# Patient Record
Sex: Female | Born: 1960 | Race: Black or African American | Hispanic: No | Marital: Single | Smoking: Never smoker
Health system: Southern US, Community
[De-identification: ages and names within clinical notes are randomized; demographics above are authoritative.]

## PROBLEM LIST (undated history)

## (undated) DIAGNOSIS — R7881 Bacteremia: Secondary | ICD-10-CM

## (undated) DIAGNOSIS — E669 Obesity, unspecified: Secondary | ICD-10-CM

## (undated) DIAGNOSIS — G4730 Sleep apnea, unspecified: Secondary | ICD-10-CM

## (undated) DIAGNOSIS — I058 Other rheumatic mitral valve diseases: Secondary | ICD-10-CM

## (undated) DIAGNOSIS — I059 Rheumatic mitral valve disease, unspecified: Secondary | ICD-10-CM

## (undated) DIAGNOSIS — D649 Anemia, unspecified: Secondary | ICD-10-CM

## (undated) DIAGNOSIS — Z5189 Encounter for other specified aftercare: Secondary | ICD-10-CM

## (undated) DIAGNOSIS — I10 Essential (primary) hypertension: Secondary | ICD-10-CM

## (undated) DIAGNOSIS — Z86010 Personal history of colonic polyps: Secondary | ICD-10-CM

## (undated) HISTORY — PX: OTHER SURGICAL HISTORY: SHX169

## (undated) HISTORY — PX: TUBAL LIGATION: SHX77

## (undated) HISTORY — DX: Encounter for other specified aftercare: Z51.89

## (undated) HISTORY — DX: Obesity, unspecified: E66.9

## (undated) HISTORY — DX: Rheumatic mitral valve disease, unspecified: I05.9

## (undated) HISTORY — DX: Anemia, unspecified: D64.9

## (undated) HISTORY — DX: Personal history of colonic polyps: Z86.010

---
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
## 2001-02-22 HISTORY — PX: BOWEL RESECTION: SHX1257

## 2006-04-04 ENCOUNTER — Encounter: Admission: RE | Admit: 2006-04-04 | Discharge: 2006-04-04 | Payer: Self-pay | Admitting: Emergency Medicine

## 2006-04-06 ENCOUNTER — Encounter: Admission: RE | Admit: 2006-04-06 | Discharge: 2006-04-06 | Payer: Self-pay | Admitting: Emergency Medicine

## 2009-12-18 ENCOUNTER — Other Ambulatory Visit: Admission: RE | Admit: 2009-12-18 | Discharge: 2009-12-18 | Payer: Self-pay | Admitting: Family Medicine

## 2012-10-06 ENCOUNTER — Encounter: Payer: Self-pay | Admitting: Gynecology

## 2012-10-06 ENCOUNTER — Ambulatory Visit (INDEPENDENT_AMBULATORY_CARE_PROVIDER_SITE_OTHER): Payer: BC Managed Care – PPO | Admitting: Gynecology

## 2012-10-06 VITALS — BP 130/80 | HR 82 | Resp 14 | Ht 66.0 in | Wt 329.0 lb

## 2012-10-06 DIAGNOSIS — Z01419 Encounter for gynecological examination (general) (routine) without abnormal findings: Secondary | ICD-10-CM

## 2012-10-06 DIAGNOSIS — Z124 Encounter for screening for malignant neoplasm of cervix: Secondary | ICD-10-CM

## 2012-10-06 DIAGNOSIS — Z1211 Encounter for screening for malignant neoplasm of colon: Secondary | ICD-10-CM

## 2012-10-06 DIAGNOSIS — Z0000 Encounter for general adult medical examination without abnormal findings: Secondary | ICD-10-CM

## 2012-10-06 LAB — POCT URINALYSIS DIPSTICK: Blood, UA: 2

## 2012-10-06 NOTE — Progress Notes (Signed)
52 y.o. Single African American female  [phone] here for annual exam. Pt reports menses are absent.  She does report hot flashes, does have night sweats, does not have vaginal dryness.  She is not using lubricants,.  She does not report post-menopasual bleeding.  Pt states night sweats and hot flashes are not bothersome enough to treat.  Patient's last menstrual period was 07/24/2011.          Sexually active: yes  The current method of family planning is none.    Exercising: no  Exercise is limited by tendonitis in right foot. Last pap:  2011- Normal Abnormal PAP: no Mammogram: 5 years ago BSE: yes Colonoscopy: no DEXA: no Alcohol: occ drink Tobacco: no  Hgb: 11.1 ; Urine: Blood 2, Trace Leuks, Protein 2  No health maintenance topics applied.  No family history on file.  There are no active problems to display for this patient.   Past Medical History  Diagnosis Date  . Anemia   . Blood transfusion without reported diagnosis Age 52    Past Surgical History  Procedure Laterality Date  . Back surgery  Age 52    Fatty tumor removed  . Tubal ligation  15 years ago  . Bowel resection  2003    Bowel Obstruction Surgery    Allergies: Review of patient's allergies indicates not on file.  Current Outpatient Prescriptions  Medication Sig Dispense Refill  . meloxicam (MOBIC) 15 MG tablet Take 15 mg by mouth daily.      . predniSONE (DELTASONE) 10 MG tablet Take 10 mg by mouth daily.       No current facility-administered medications for this visit.    ROS: A comprehensive review of systems was negative.  Exam:    BP 130/80  Pulse 82  Resp 14  Ht 5\' 6"  (1.676 m)  Wt 329 lb (149.233 kg)  BMI 53.13 kg/m2  LMP 07/24/2011 Weight change: @WEIGHTCHANGE @ Last 3 height recordings:  Ht Readings from Last 3 Encounters:  10/06/12 5\' 6"  (1.676 m)   General appearance: alert, cooperative and appears stated age Head: Normocephalic, without obvious abnormality, atraumatic Neck:  no adenopathy, no carotid bruit, no JVD, supple, symmetrical, trachea midline and thyroid not enlarged, symmetric, no tenderness/mass/nodules Lungs: clear to auscultation bilaterally Breasts: normal appearance, no masses or tenderness Heart: regular rate and rhythm, S1, S2 normal, no murmur, click, rub or gallop Abdomen: soft, non-tender; bowel sounds normal; no masses,  no organomegaly Extremities: extremities normal, atraumatic, no cyanosis or edema Skin: Skin color, texture, turgor normal. No rashes or lesions Lymph nodes: Cervical, supraclavicular, and axillary nodes normal. no inguinal nodes palpated Neurologic: Grossly normal   Pelvic: External genitalia:  no lesions              Urethra: normal appearing urethra with no masses, tenderness or lesions              Bartholins and Skenes: normal                 Vagina: normal appearing vagina with normal color and discharge, no lesions              Cervix: normal appearance              Pap taken: yes        Bimanual Exam:  Uterus:  uterus is normal size, shape, consistency and nontender  Adnexa:    no masses                                      Rectovaginal: Confirms                                      Anus:  normal sphincter tone, no lesions  A: well woman Contraceptive management     P: mammogram due pap smear guidelines reviewed counseled on breast self exam, mammography screening, menopause, adequate intake of calcium and vitamin D, diet and exercise Will rto for fasting lipid and cmet return annually or prn Discussed PAP guideline changes, importance of weight bearing exercises, calcium, vit D and balanced diet.  An After Visit Summary was printed and given to the patient.

## 2012-10-06 NOTE — Patient Instructions (Signed)

## 2012-10-11 ENCOUNTER — Telehealth: Payer: Self-pay | Admitting: *Deleted

## 2012-10-11 NOTE — Telephone Encounter (Signed)
Message copied by Lorraine Lax on Wed Oct 11, 2012 11:08 AM ------      Message from: Douglass Rivers      Created: Wed Oct 11, 2012 10:18 AM       Inform PAP normal recall 2, hemoglobin low to take otc iron and will get CBC when she comes in for fasting labs, recommend she wait 4-6w before coming in for those ------

## 2012-10-11 NOTE — Telephone Encounter (Signed)
Tried to call patient with results, Vm says this is not a working #

## 2012-10-13 ENCOUNTER — Encounter: Payer: Self-pay | Admitting: *Deleted

## 2012-10-13 NOTE — Telephone Encounter (Signed)
Letter Sent for patient to call us.

## 2012-11-09 ENCOUNTER — Other Ambulatory Visit (INDEPENDENT_AMBULATORY_CARE_PROVIDER_SITE_OTHER): Payer: BC Managed Care – PPO

## 2012-11-09 DIAGNOSIS — Z0000 Encounter for general adult medical examination without abnormal findings: Secondary | ICD-10-CM

## 2012-11-09 LAB — COMPREHENSIVE METABOLIC PANEL
AST: 26 U/L (ref 0–37)
Albumin: 4.2 g/dL (ref 3.5–5.2)
CO2: 27 mEq/L (ref 19–32)
Calcium: 9.5 mg/dL (ref 8.4–10.5)
Chloride: 104 mEq/L (ref 96–112)
Potassium: 4 mEq/L (ref 3.5–5.3)
Sodium: 139 mEq/L (ref 135–145)
Total Protein: 7.4 g/dL (ref 6.0–8.3)

## 2012-11-09 LAB — LIPID PANEL
Cholesterol: 221 mg/dL — ABNORMAL HIGH (ref 0–200)
HDL: 77 mg/dL (ref >39)
Triglycerides: 56 mg/dL (ref <150)
VLDL: 11 mg/dL (ref 0–40)

## 2012-11-20 NOTE — Telephone Encounter (Signed)
Patient has not called our office closing encounter.

## 2012-12-05 ENCOUNTER — Ambulatory Visit: Payer: BC Managed Care – PPO | Admitting: Podiatry

## 2012-12-12 ENCOUNTER — Ambulatory Visit: Payer: BC Managed Care – PPO | Admitting: Podiatry

## 2012-12-28 ENCOUNTER — Other Ambulatory Visit: Payer: Self-pay

## 2013-05-04 ENCOUNTER — Encounter: Payer: Self-pay | Admitting: Internal Medicine

## 2013-05-04 ENCOUNTER — Other Ambulatory Visit: Payer: Self-pay | Admitting: Internal Medicine

## 2013-05-04 DIAGNOSIS — Z1231 Encounter for screening mammogram for malignant neoplasm of breast: Secondary | ICD-10-CM

## 2013-05-15 ENCOUNTER — Ambulatory Visit: Payer: Self-pay

## 2013-05-24 ENCOUNTER — Ambulatory Visit
Admission: RE | Admit: 2013-05-24 | Discharge: 2013-05-24 | Disposition: A | Discharge status: Home / Self Care | Payer: BC Managed Care – PPO | Source: Ambulatory Visit | Attending: Internal Medicine | Admitting: Internal Medicine

## 2013-05-24 DIAGNOSIS — Z1231 Encounter for screening mammogram for malignant neoplasm of breast: Secondary | ICD-10-CM

## 2013-06-25 ENCOUNTER — Telehealth: Payer: Self-pay

## 2013-06-25 NOTE — Telephone Encounter (Signed)
Pt scheduled for PV tomorrow (06/26/13).  BMI 53.13 on 10-06-12.  Will check weight tomorrow and f/u with you whether or not pt needs to be done at [facility].  Thank you!

## 2013-06-25 NOTE — Telephone Encounter (Signed)
If BMI is greater than 50, Dr. Celesta Aver next hospital week is June 15.

## 2013-06-26 ENCOUNTER — Ambulatory Visit (AMBULATORY_SURGERY_CENTER): Payer: BC Managed Care – PPO

## 2013-06-26 VITALS — Ht 67.0 in | Wt 349.0 lb

## 2013-06-26 DIAGNOSIS — Z1211 Encounter for screening for malignant neoplasm of colon: Secondary | ICD-10-CM

## 2013-06-26 MED ORDER — NA SULFATE-K SULFATE-MG SULF 17.5-3.13-1.6 GM/177ML PO SOLN: ORAL | Status: DC

## 2013-06-26 NOTE — Progress Notes (Signed)
Pt came into the office today for her pre-visit prior to her colonoscopy with Dr Carlean Purl on 07/10/13. Due to weight restrictions and a BMI of 54.66, pt was informed we would need to schedule her colonoscopy at [facility]. Pt understood. Colon appt on 07/10/13 was cancelled and a message was sent to Sheri-RN to schedule the pt at the hospital and call the pt with the new appointment.I did go over all the prep instructions with the pt. Per pt , no allergies to soy or egg. No O2 or problems with sedation.She is not taking any weight loss meds.She will call if she has any further questions.

## 2013-07-10 ENCOUNTER — Encounter: Payer: BC Managed Care – PPO | Admitting: Internal Medicine

## 2013-09-07 ENCOUNTER — Telehealth: Payer: Self-pay | Admitting: Internal Medicine

## 2013-09-07 ENCOUNTER — Other Ambulatory Visit: Payer: Self-pay

## 2013-09-07 DIAGNOSIS — Z1211 Encounter for screening for malignant neoplasm of colon: Secondary | ICD-10-CM

## 2013-09-07 MED ORDER — NA SULFATE-K SULFATE-MG SULF 17.5-3.13-1.6 GM/177ML PO SOLN
1.0000 | Freq: Once | ORAL | Status: AC
Start: 2013-09-07 — End: 2013-10-07

## 2013-09-07 NOTE — Telephone Encounter (Signed)
Patient is scheduled for a colonoscopy at [facility] for 11/05/13 8:30. Patient is notified of the day and time and agrees to proceed.  She is mailed new instructions and I sent a new rx to the pharmacy

## 2013-10-15 ENCOUNTER — Ambulatory Visit: Payer: BC Managed Care – PPO | Admitting: Gynecology

## 2013-11-02 ENCOUNTER — Encounter: Payer: Self-pay | Admitting: Gynecology

## 2013-11-02 ENCOUNTER — Ambulatory Visit (INDEPENDENT_AMBULATORY_CARE_PROVIDER_SITE_OTHER): Payer: BC Managed Care – PPO | Admitting: Gynecology

## 2013-11-02 ENCOUNTER — Telehealth: Payer: Self-pay | Admitting: Internal Medicine

## 2013-11-02 VITALS — BP 140/80 | HR 88 | Resp 20 | Ht 66.0 in | Wt 356.0 lb

## 2013-11-02 DIAGNOSIS — Z01419 Encounter for gynecological examination (general) (routine) without abnormal findings: Secondary | ICD-10-CM

## 2013-11-02 DIAGNOSIS — Z0000 Encounter for general adult medical examination without abnormal findings: Secondary | ICD-10-CM

## 2013-11-02 LAB — POCT URINALYSIS DIPSTICK
BILIRUBIN UA: NEGATIVE
Glucose, UA: NEGATIVE
KETONES UA: NEGATIVE
LEUKOCYTES UA: NEGATIVE
NITRITE UA: NEGATIVE
PH UA: 5
PROTEIN UA: NEGATIVE
Urobilinogen, UA: NEGATIVE

## 2013-11-02 LAB — HEMOGLOBIN, FINGERSTICK: Hemoglobin, fingerstick: 11.8 g/dL — ABNORMAL LOW (ref 12.0–16.0)

## 2013-11-02 NOTE — Telephone Encounter (Signed)
Spoke with patient and clarified instructions for patient on Monday. She should start the prep on Sunday and on Monday at 3:30 AM do second part.

## 2013-11-02 NOTE — Progress Notes (Signed)
53 y.o. Single African American female   [phone] here for annual exam. Pt reports menses are not regular, pt believes last cycle has now been over 1y.  She does report hot flashes, does have night sweats, does not have vaginal dryness.  She is not using lubricants.    No LMP recorded. Patient is not currently having periods (Reason: Perimenopausal).          Sexually active: No.  The current method of family planning is tubal ligation.    Exercising: Yes.    Walking daily Last pap: 09/2012 neg. HR HPV neg Abnormal PAP: yes Mammogram: 05/2013 BIRADS1: Neg BSE: yes, once a month Colonoscopy none. Has one schedule for next week DEXA: None Alcohol: yes, Occ Tobacco: No  Health Maintenance  Topic Date Due  . Tetanus/tdap  10/30/1980  . Colonoscopy  10/31/2011  . Influenza Vaccine  09/22/2013  . Mammogram  05/25/2015  . Pap Smear  10/07/2015    Family History  Problem Relation Age of Onset  . Adopted: Yes    Patient Active Problem List   Diagnosis Date Noted  . Morbid obesity 10/06/2012    Past Medical History  Diagnosis Date  . Anemia   . Blood transfusion without reported diagnosis Age 57  . Obesity (BMI 35.0-39.9 without comorbidity)     Past Surgical History  Procedure Laterality Date  . Back surgery  Age 49    Fatty tumor removed  . Tubal ligation  15 years ago  . Bowel resection  2003    Bowel Obstruction Surgery    Allergies: Review of patient's allergies indicates no known allergies.  No current outpatient prescriptions on file.   No current facility-administered medications for this visit.    ROS: Pertinent items are noted in HPI.  Exam:    BP 140/80  Pulse 88  Resp 20  Ht 5\' 6"  (1.676 m)  Wt 356 lb (161.481 kg)  BMI 57.49 kg/m2 Weight change: @WEIGHTCHANGE @ Last 3 height recordings:  Ht Readings from Last 3 Encounters:  11/02/13 5\' 6"  (1.676 m)  06/26/13 5\' 7"  (1.702 m)  10/06/12 5\' 6"  (1.676 m)   General appearance: alert, cooperative and  appears stated age Head: Normocephalic, without obvious abnormality, atraumatic Neck: no adenopathy, no carotid bruit, no JVD, supple, symmetrical, trachea midline and thyroid not enlarged, symmetric, no tenderness/mass/nodules Lungs: clear to auscultation bilaterally Breasts: normal appearance, no masses or tenderness Heart: regular rate and rhythm, S1, S2 normal, no murmur, click, rub or gallop Abdomen: soft, non-tender; bowel sounds normal; no masses,  no organomegaly Extremities: extremities normal, atraumatic, no cyanosis or edema Skin: Skin color, texture, turgor normal. No rashes or lesions Lymph nodes: Cervical, supraclavicular, and axillary nodes normal. no inguinal nodes palpated Neurologic: Grossly normal   Pelvic: External genitalia:  no lesions              Urethra: normal appearing urethra with no masses, tenderness or lesions              Bartholins and Skenes: Bartholin's, Urethra, Skene's normal                 Vagina: normal appearing vagina with normal color and discharge, no lesions              Cervix: normal appearance              Pap taken: No.        Bimanual Exam:  Uterus:  uterus is normal size, shape,  consistency and nontender, limited by habitus                                      Adnexa:    no masses                                      Rectovaginal: Confirms                                      Anus:  normal sphincter tone, no lesions      1. Routine gynecological examination  counseled on breast self exam, mammography screening, menopause, adequate intake of calcium and vitamin D, diet and exercise Labs with PCP Informed any bleeding after this point is considered post-menopausal and will need to be evaluated, agrees return annually or prn Discussed PAP guideline changes, importance of weight bearing exercises, calcium, vit D and balanced diet.  2. Laboratory examination ordered as part of a routine general medical examination  - POCT Urinalysis  Dipstick - Hemoglobin, fingerstick  An After Visit Summary was printed and given to the patient.

## 2013-11-05 ENCOUNTER — Encounter (HOSPITAL_COMMUNITY)
Admission: RE | Disposition: A | Discharge status: Home / Self Care | Payer: Self-pay | Source: Ambulatory Visit | Attending: Internal Medicine

## 2013-11-05 ENCOUNTER — Ambulatory Visit (HOSPITAL_COMMUNITY)
Admission: RE | Admit: 2013-11-05 | Discharge: 2013-11-05 | Disposition: A | Discharge status: Home / Self Care | Payer: BC Managed Care – PPO | Source: Ambulatory Visit | Attending: Internal Medicine | Admitting: Internal Medicine

## 2013-11-05 DIAGNOSIS — Z6835 Body mass index (BMI) 35.0-35.9, adult: Secondary | ICD-10-CM | POA: Insufficient documentation

## 2013-11-05 DIAGNOSIS — Z1211 Encounter for screening for malignant neoplasm of colon: Secondary | ICD-10-CM | POA: Insufficient documentation

## 2013-11-05 DIAGNOSIS — Z860100 Personal history of colon polyps, unspecified: Secondary | ICD-10-CM

## 2013-11-05 DIAGNOSIS — D126 Benign neoplasm of colon, unspecified: Secondary | ICD-10-CM

## 2013-11-05 DIAGNOSIS — E669 Obesity, unspecified: Secondary | ICD-10-CM | POA: Insufficient documentation

## 2013-11-05 DIAGNOSIS — K5730 Diverticulosis of large intestine without perforation or abscess without bleeding: Secondary | ICD-10-CM | POA: Diagnosis not present

## 2013-11-05 HISTORY — PX: COLONOSCOPY: SHX5424

## 2013-11-05 HISTORY — DX: Personal history of colonic polyps: Z86.010

## 2013-11-05 HISTORY — DX: Personal history of colon polyps, unspecified: Z86.0100

## 2013-11-05 SURGERY — COLONOSCOPY
Anesthesia: Moderate Sedation

## 2013-11-05 MED ORDER — FENTANYL CITRATE 0.05 MG/ML IJ SOLN
INTRAMUSCULAR | Status: DC | PRN
  Administered 2013-11-05 (×3): 25 ug via INTRAVENOUS

## 2013-11-05 MED ORDER — FENTANYL CITRATE 0.05 MG/ML IJ SOLN
INTRAMUSCULAR | Status: AC
  Filled 2013-11-05: qty 2

## 2013-11-05 MED ORDER — MIDAZOLAM HCL 5 MG/5ML IJ SOLN
INTRAMUSCULAR | Status: DC | PRN
  Administered 2013-11-05 (×4): 2 mg via INTRAVENOUS

## 2013-11-05 MED ORDER — MIDAZOLAM HCL 10 MG/2ML IJ SOLN
INTRAMUSCULAR | Status: AC
  Filled 2013-11-05: qty 2

## 2013-11-05 MED ORDER — SODIUM CHLORIDE 0.9 % IV SOLN: INTRAVENOUS | Status: DC

## 2013-11-05 NOTE — Op Note (Signed)
[facility] [place], [address]   COLONOSCOPY PROCEDURE REPORT  PATIENT: Tiffany Bullock, Tiffany Bullock  MR#: [id number] BIRTHDATE: February 04, 1961 , 53  yrs. old GENDER: Female ENDOSCOPIST: [name], MD, [facility] PROCEDURE DATE:  11/05/2013 PROCEDURE:   Colonoscopy with biopsy First Screening Colonoscopy - Avg.  risk and is 50 yrs.  old or older Yes.  Prior Negative Screening - Now for repeat screening. N/A  History of Adenoma - Now for follow-up colonoscopy & has been > or = to 3 yrs.  N/A  Polyps Removed Today? Yes. ASA CLASS:   Class II INDICATIONS:average risk screening and first colonoscopy. MEDICATIONS: Fentanyl 75 mcg IV and Versed 8 mg IV  DESCRIPTION OF PROCEDURE:   After the risks benefits and alternatives of the procedure were thoroughly explained, informed consent was obtained.  A digital rectal exam revealed no abnormalities of the rectum.   The Pentax Ped Colon [id number] endoscope was introduced through the anus and advanced to the cecum, which was identified by both the appendix and ileocecal valve. No adverse events experienced.   The quality of the prep was excellent using Suprep  The instrument was then slowly withdrawn as the colon was fully examined.  COLON FINDINGS: A sessile polyp measuring 2 mm in size was found in the ascending colon.  A polypectomy was performed with cold forceps.  The resection was complete and the polyp tissue was completely retrieved.   Moderate diverticulosis was noted.   Mild diverticulosis was noted The finding was in the right colon.   The colon mucosa was otherwise normal.   A right colon retroflexion was performed.  Retroflexed views revealed no abnormalities. The time to cecum=5 minutes 0 seconds.  Withdrawal time=8 minutes 0 seconds. The scope was withdrawn and the procedure completed. COMPLICATIONS: There were no complications.  ENDOSCOPIC IMPRESSION: 1.   Sessile polyp measuring 2 mm in size was found in  the ascending colon; polypectomy was performed with cold forceps 2.   Moderate diverticulosis was noted 3.   Mild diverticulosis was noted in the right colon 4.   The colon mucosa was otherwise normal - excellent prep  RECOMMENDATIONS: 1.  Timing of repeat colonoscopy will be determined by pathology findings. 2.   Will be 5-10 years   eSigned:  [name], MD, [facility] 11/05/2013 9:10 AM   cc: The Patient, Velna Hatchet, MD  and Elveria Rising, MD

## 2013-11-05 NOTE — Discharge Instructions (Addendum)
I found and removed one tiny polyp - do not worry it does not look like cancer. You also have a condition called diverticulosis - common and not usually a problem. Please read the handout provided.  I will let you know pathology results and when to have another routine colonoscopy by mail.  I appreciate the opportunity to care for you. [name], MD, FACG   YOU HAD AN ENDOSCOPIC PROCEDURE TODAY: Refer to the procedure report and other information in the discharge instructions given to you for any specific questions about what was found during the examination. If this information does not answer your questions, please call Dr. Celesta Aver office at [phone] to clarify.   YOU SHOULD EXPECT: Some feelings of bloating in the abdomen. Passage of more gas than usual. Walking can help get rid of the air that was put into your GI tract during the procedure and reduce the bloating. If you had a lower endoscopy (such as a colonoscopy or flexible sigmoidoscopy) you may notice spotting of blood in your stool or on the toilet paper. Some abdominal soreness may be present for a day or two, also.  DIET: Your first meal following the procedure should be a light meal and then it is ok to progress to your normal diet. A half-sandwich or bowl of soup is an example of a good first meal. Heavy or fried foods are harder to digest and may make you feel nauseous or bloated. Drink plenty of fluids but you should avoid alcoholic beverages for 24 hours.   ACTIVITY: Your care partner should take you home directly after the procedure. You should plan to take it easy, moving slowly for the rest of the day. You can resume normal activity the day after the procedure however YOU SHOULD NOT DRIVE, use power tools, machinery or perform tasks that involve climbing or major physical exertion for 24 hours (because of the sedation medicines used during the test).   SYMPTOMS TO REPORT IMMEDIATELY: A gastroenterologist can be  reached at any hour. Please call [phone]  for any of the following symptoms:  Following lower endoscopy (colonoscopy, flexible sigmoidoscopy) Excessive amounts of blood in the stool  Significant tenderness, worsening of abdominal pains  Swelling of the abdomen that is new, acute  Fever of 100 or higher  Following upper endoscopy (EGD, EUS, ERCP, esophageal dilation) Vomiting of blood or coffee ground material  New, significant abdominal pain  New, significant chest pain or pain under the shoulder blades  Painful or persistently difficult swallowing  New shortness of breath  Black, tarry-looking or red, bloody stools  FOLLOW UP:  If any biopsies were taken you will be contacted by phone or by letter within the next 1-3 weeks. Call [phone]  if you have not heard about the biopsies in 3 weeks.  Please also call with any specific questions about appointments or follow up tests.  Colonoscopy, Care After These instructions give you information on caring for yourself after your procedure. Your doctor may also give you more specific instructions. Call your doctor if you have any problems or questions after your procedure. HOME CARE  Do not drive for 24 hours.  Do not sign important papers or use machinery for 24 hours.  You may shower.  You may go back to your usual activities, but go slower for the first 24 hours.  Take rest breaks often during the first 24 hours.  Walk around or use warm packs on your belly (abdomen) if you have  belly cramping or gas.  Drink enough fluids to keep your pee (urine) clear or pale yellow.  Resume your normal diet. Avoid heavy or fried foods.  Avoid drinking alcohol for 24 hours or as told by your doctor.  Only take medicines as told by your doctor. If a tissue sample (biopsy) was taken during the procedure:   Do not take aspirin or blood thinners for 7 days, or as told by your doctor.  Do not drink alcohol for 7 days, or as told by your  doctor.  Eat soft foods for the first 24 hours. GET HELP IF: You still have a small amount of blood in your poop (stool) 2-3 days after the procedure. GET HELP RIGHT AWAY IF:  You have more than a small amount of blood in your poop.  You see clumps of tissue (blood clots) in your poop.  Your belly is puffy (swollen).  You feel sick to your stomach (nauseous) or throw up (vomit).  You have a fever.  You have belly pain that gets worse and medicine does not help. MAKE SURE YOU:  Understand these instructions.  Will watch your condition.  Will get help right away if you are not doing well or get worse. Document Released: 03/13/2010 Document Revised: 02/13/2013 Document Reviewed: 10/16/2012 [facility] Patient Information 2015 [place], [place]. This information is not intended to replace advice given to you by your health care provider. Make sure you discuss any questions you have with your health care provider.

## 2013-11-05 NOTE — H&P (Signed)
  [facility] History and Physical   Primary Care Physician:  Velna Hatchet, MD   Reason for Procedure:   Routine colon cancer screening   Plan:    Colonoscopy     HPI: Tiffany Bullock is a 53 y.o. female here for colon cancer screening with colonoscopy. She does not have any active GI Sxs and feels ok prior to procedure.   Past Medical History  Diagnosis Date  . Anemia   . Blood transfusion without reported diagnosis Age 72  . Obesity (BMI 35.0-39.9 without comorbidity)     Past Surgical History  Procedure Laterality Date  . Back surgery  Age 58    Fatty tumor removed  . Tubal ligation  15 years ago  . Bowel resection  2003    Bowel Obstruction Surgery    Prior to Admission medications   None    Current Facility-Administered Medications  Medication Dose Route Frequency Provider Last Rate Last Dose  . 0.9 %  sodium chloride infusion   Intravenous Continuous [name], MD        Allergies as of 09/07/2013  . (No Known Allergies)    Family History  Problem Relation Age of Onset  . Adopted: Yes    History   Social History  . Marital Status: Single                  Social History Main Topics  . Smoking status: Never Smoker   . Smokeless tobacco: Never Used  . Alcohol Use: 0.5 oz/week    1 drink(s) per week  . Drug Use: No  . Sexual Activity: Not Currently    Partners: Male      Review of Systems: All other review of systems negative except as mentioned in the HPI.  Physical Exam: Vital signs in last 24 hours: Temp:  [97.8 F (36.6 C)] 97.8 F (36.6 C) (09/14 0740) Pulse Rate:  [82] 82 (09/14 0735) Resp:  [27] 27 (09/14 0735) BP: (143)/(81) 143/81 mmHg (09/14 0735)   General:   Alert,  Well-developed, well-nourished, pleasant and cooperative in NAD - obese Lungs:  Clear throughout to auscultation.   Heart:  Regular rate and rhythm; no murmurs, clicks, rubs,  or gallops. Abdomen:  Obese, Soft, nontender and  nondistended. Normal bowel sounds.   Neuro/Psych:  Alert and cooperative. Normal mood and affect. A and O x 3   @Carl  Simonne Maffucci, MD, [facility] [phone] (pager) 11/05/2013 8:13 AM@

## 2013-11-06 ENCOUNTER — Encounter (HOSPITAL_COMMUNITY): Payer: Self-pay | Admitting: Internal Medicine

## 2013-11-08 ENCOUNTER — Encounter: Payer: Self-pay | Admitting: Internal Medicine

## 2013-11-08 NOTE — Progress Notes (Signed)
Quick Note:  10/2013 - diminutive adenoma - repeat colon 2022 ______

## 2013-12-07 ENCOUNTER — Other Ambulatory Visit: Payer: Self-pay

## 2013-12-24 ENCOUNTER — Encounter: Payer: Self-pay | Admitting: Internal Medicine

## 2014-01-23 ENCOUNTER — Telehealth: Payer: Self-pay

## 2014-01-23 NOTE — Telephone Encounter (Signed)
Left message with member of family. Pt will call the office back

## 2014-11-04 ENCOUNTER — Ambulatory Visit: Payer: BC Managed Care – PPO | Admitting: Gynecology

## 2015-02-04 ENCOUNTER — Ambulatory Visit: Payer: Self-pay | Admitting: Physician Assistant

## 2015-02-12 ENCOUNTER — Ambulatory Visit: Payer: Self-pay | Admitting: Internal Medicine

## 2016-08-30 ENCOUNTER — Ambulatory Visit: Payer: BLUE CROSS/BLUE SHIELD | Admitting: Dietician

## 2018-10-23 ENCOUNTER — Encounter (HOSPITAL_COMMUNITY): Payer: Self-pay | Admitting: Emergency Medicine

## 2018-10-23 ENCOUNTER — Inpatient Hospital Stay (HOSPITAL_COMMUNITY)
Admission: EM | Admit: 2018-10-23 | Discharge: 2018-11-06 | DRG: 854 | Disposition: A | Discharge status: Home Health | Payer: BC Managed Care – PPO | Attending: Student in an Organized Health Care Education/Training Program | Admitting: Student in an Organized Health Care Education/Training Program

## 2018-10-23 ENCOUNTER — Other Ambulatory Visit: Payer: Self-pay

## 2018-10-23 ENCOUNTER — Emergency Department (HOSPITAL_COMMUNITY): Payer: BC Managed Care – PPO

## 2018-10-23 DIAGNOSIS — R7303 Prediabetes: Secondary | ICD-10-CM | POA: Diagnosis present

## 2018-10-23 DIAGNOSIS — I059 Rheumatic mitral valve disease, unspecified: Secondary | ICD-10-CM | POA: Diagnosis present

## 2018-10-23 DIAGNOSIS — M009 Pyogenic arthritis, unspecified: Secondary | ICD-10-CM | POA: Diagnosis present

## 2018-10-23 DIAGNOSIS — Z8719 Personal history of other diseases of the digestive system: Secondary | ICD-10-CM

## 2018-10-23 DIAGNOSIS — M25511 Pain in right shoulder: Secondary | ICD-10-CM | POA: Diagnosis not present

## 2018-10-23 DIAGNOSIS — R52 Pain, unspecified: Secondary | ICD-10-CM

## 2018-10-23 DIAGNOSIS — E785 Hyperlipidemia, unspecified: Secondary | ICD-10-CM | POA: Diagnosis present

## 2018-10-23 DIAGNOSIS — Z8614 Personal history of Methicillin resistant Staphylococcus aureus infection: Secondary | ICD-10-CM

## 2018-10-23 DIAGNOSIS — R7881 Bacteremia: Secondary | ICD-10-CM

## 2018-10-23 DIAGNOSIS — K5900 Constipation, unspecified: Secondary | ICD-10-CM | POA: Diagnosis present

## 2018-10-23 DIAGNOSIS — A4102 Sepsis due to Methicillin resistant Staphylococcus aureus: Principal | ICD-10-CM | POA: Diagnosis present

## 2018-10-23 DIAGNOSIS — B9562 Methicillin resistant Staphylococcus aureus infection as the cause of diseases classified elsewhere: Secondary | ICD-10-CM | POA: Diagnosis present

## 2018-10-23 DIAGNOSIS — I151 Hypertension secondary to other renal disorders: Secondary | ICD-10-CM | POA: Diagnosis present

## 2018-10-23 DIAGNOSIS — R079 Chest pain, unspecified: Secondary | ICD-10-CM

## 2018-10-23 DIAGNOSIS — Z8739 Personal history of other diseases of the musculoskeletal system and connective tissue: Secondary | ICD-10-CM | POA: Diagnosis present

## 2018-10-23 DIAGNOSIS — M1990 Unspecified osteoarthritis, unspecified site: Secondary | ICD-10-CM | POA: Diagnosis present

## 2018-10-23 DIAGNOSIS — M869 Osteomyelitis, unspecified: Secondary | ICD-10-CM | POA: Diagnosis present

## 2018-10-23 DIAGNOSIS — Z794 Long term (current) use of insulin: Secondary | ICD-10-CM

## 2018-10-23 DIAGNOSIS — G4733 Obstructive sleep apnea (adult) (pediatric): Secondary | ICD-10-CM | POA: Diagnosis present

## 2018-10-23 DIAGNOSIS — Z20828 Contact with and (suspected) exposure to other viral communicable diseases: Secondary | ICD-10-CM | POA: Diagnosis present

## 2018-10-23 DIAGNOSIS — Z8679 Personal history of other diseases of the circulatory system: Secondary | ICD-10-CM

## 2018-10-23 DIAGNOSIS — I700 Atherosclerosis of aorta: Secondary | ICD-10-CM | POA: Diagnosis present

## 2018-10-23 DIAGNOSIS — R0789 Other chest pain: Secondary | ICD-10-CM | POA: Diagnosis present

## 2018-10-23 DIAGNOSIS — N189 Chronic kidney disease, unspecified: Secondary | ICD-10-CM | POA: Diagnosis present

## 2018-10-23 DIAGNOSIS — Z6841 Body Mass Index (BMI) 40.0 and over, adult: Secondary | ICD-10-CM

## 2018-10-23 DIAGNOSIS — N179 Acute kidney failure, unspecified: Secondary | ICD-10-CM | POA: Diagnosis present

## 2018-10-23 DIAGNOSIS — Z79899 Other long term (current) drug therapy: Secondary | ICD-10-CM

## 2018-10-23 DIAGNOSIS — I10 Essential (primary) hypertension: Secondary | ICD-10-CM | POA: Diagnosis present

## 2018-10-23 HISTORY — DX: Sleep apnea, unspecified: G47.30

## 2018-10-23 LAB — COMPREHENSIVE METABOLIC PANEL
ALT: 17 U/L (ref 0–44)
AST: 21 U/L (ref 15–41)
Albumin: 4.2 g/dL (ref 3.5–5.0)
Alkaline Phosphatase: 90 U/L (ref 38–126)
Anion gap: 13 (ref 5–15)
BUN: 18 mg/dL (ref 6–20)
CO2: 23 mmol/L (ref 22–32)
Calcium: 9.9 mg/dL (ref 8.9–10.3)
Chloride: 99 mmol/L (ref 98–111)
Creatinine, Ser: 1.14 mg/dL — ABNORMAL HIGH (ref 0.44–1.00)
GFR calc Af Amer: >60 mL/min (ref >60)
GFR calc non Af Amer: 54 mL/min — ABNORMAL LOW (ref >60)
Glucose, Bld: 144 mg/dL — ABNORMAL HIGH (ref 70–99)
Potassium: 3.9 mmol/L (ref 3.5–5.1)
Sodium: 135 mmol/L (ref 135–145)
Total Bilirubin: 0.8 mg/dL (ref 0.3–1.2)
Total Protein: 8 g/dL (ref 6.5–8.1)

## 2018-10-23 LAB — CBC WITH DIFFERENTIAL/PLATELET
Abs Immature Granulocytes: 0.03 10*3/uL (ref 0.00–0.07)
Basophils Absolute: 0.1 10*3/uL (ref 0.0–0.1)
Basophils Relative: 1 %
Eosinophils Absolute: 0.1 10*3/uL (ref 0.0–0.5)
Eosinophils Relative: 1 %
HCT: 38.1 % (ref 36.0–46.0)
Hemoglobin: 12 g/dL (ref 12.0–15.0)
Immature Granulocytes: 0 %
Lymphocytes Relative: 15 %
Lymphs Abs: 1.7 10*3/uL (ref 0.7–4.0)
MCH: 26.2 pg (ref 26.0–34.0)
MCHC: 31.5 g/dL (ref 30.0–36.0)
MCV: 83.2 fL (ref 80.0–100.0)
Monocytes Absolute: 0.8 10*3/uL (ref 0.1–1.0)
Monocytes Relative: 7 %
Neutro Abs: 8.6 10*3/uL — ABNORMAL HIGH (ref 1.7–7.7)
Neutrophils Relative %: 76 %
Platelets: 293 10*3/uL (ref 150–400)
RBC: 4.58 MIL/uL (ref 3.87–5.11)
RDW: 15.2 % (ref 11.5–15.5)
WBC: 11.3 10*3/uL — ABNORMAL HIGH (ref 4.0–10.5)
nRBC: 0 % (ref 0.0–0.2)

## 2018-10-23 LAB — C-REACTIVE PROTEIN: CRP: 6.1 mg/dL — ABNORMAL HIGH (ref <1.0)

## 2018-10-23 LAB — SEDIMENTATION RATE: Sed Rate: 36 mm/hr — ABNORMAL HIGH (ref 0–22)

## 2018-10-23 LAB — LACTIC ACID, PLASMA: Lactic Acid, Venous: 1.3 mmol/L (ref 0.5–1.9)

## 2018-10-23 NOTE — ED Triage Notes (Addendum)
Pt here for eval of right shoulder pain that is worse with movement, states she cannot lift the arm. She has had the pain for 1 day. Denies shortness of breath or chest pain. Pt right collar bone is tender to palpation.

## 2018-10-24 ENCOUNTER — Emergency Department (HOSPITAL_COMMUNITY): Payer: BC Managed Care – PPO

## 2018-10-24 ENCOUNTER — Encounter (HOSPITAL_COMMUNITY): Payer: Self-pay | Admitting: Radiology

## 2018-10-24 ENCOUNTER — Inpatient Hospital Stay (HOSPITAL_COMMUNITY): Payer: BC Managed Care – PPO

## 2018-10-24 ENCOUNTER — Other Ambulatory Visit: Payer: Self-pay

## 2018-10-24 DIAGNOSIS — A4102 Sepsis due to Methicillin resistant Staphylococcus aureus: Secondary | ICD-10-CM | POA: Diagnosis present

## 2018-10-24 DIAGNOSIS — I700 Atherosclerosis of aorta: Secondary | ICD-10-CM | POA: Insufficient documentation

## 2018-10-24 DIAGNOSIS — Z6841 Body Mass Index (BMI) 40.0 and over, adult: Secondary | ICD-10-CM | POA: Diagnosis not present

## 2018-10-24 DIAGNOSIS — Z7984 Long term (current) use of oral hypoglycemic drugs: Secondary | ICD-10-CM

## 2018-10-24 DIAGNOSIS — R7881 Bacteremia: Secondary | ICD-10-CM | POA: Diagnosis not present

## 2018-10-24 DIAGNOSIS — N179 Acute kidney failure, unspecified: Secondary | ICD-10-CM

## 2018-10-24 DIAGNOSIS — Z8739 Personal history of other diseases of the musculoskeletal system and connective tissue: Secondary | ICD-10-CM | POA: Diagnosis present

## 2018-10-24 DIAGNOSIS — K5900 Constipation, unspecified: Secondary | ICD-10-CM | POA: Diagnosis present

## 2018-10-24 DIAGNOSIS — I10 Essential (primary) hypertension: Secondary | ICD-10-CM

## 2018-10-24 DIAGNOSIS — Z8614 Personal history of Methicillin resistant Staphylococcus aureus infection: Secondary | ICD-10-CM | POA: Diagnosis not present

## 2018-10-24 DIAGNOSIS — Z20828 Contact with and (suspected) exposure to other viral communicable diseases: Secondary | ICD-10-CM | POA: Diagnosis present

## 2018-10-24 DIAGNOSIS — I059 Rheumatic mitral valve disease, unspecified: Secondary | ICD-10-CM | POA: Diagnosis present

## 2018-10-24 DIAGNOSIS — R7303 Prediabetes: Secondary | ICD-10-CM | POA: Diagnosis present

## 2018-10-24 DIAGNOSIS — M869 Osteomyelitis, unspecified: Secondary | ICD-10-CM | POA: Diagnosis present

## 2018-10-24 DIAGNOSIS — B9562 Methicillin resistant Staphylococcus aureus infection as the cause of diseases classified elsewhere: Secondary | ICD-10-CM | POA: Diagnosis present

## 2018-10-24 DIAGNOSIS — M009 Pyogenic arthritis, unspecified: Secondary | ICD-10-CM | POA: Diagnosis present

## 2018-10-24 DIAGNOSIS — G4733 Obstructive sleep apnea (adult) (pediatric): Secondary | ICD-10-CM | POA: Diagnosis present

## 2018-10-24 DIAGNOSIS — M25511 Pain in right shoulder: Secondary | ICD-10-CM | POA: Diagnosis present

## 2018-10-24 DIAGNOSIS — N189 Chronic kidney disease, unspecified: Secondary | ICD-10-CM | POA: Diagnosis present

## 2018-10-24 DIAGNOSIS — Z79899 Other long term (current) drug therapy: Secondary | ICD-10-CM

## 2018-10-24 DIAGNOSIS — Z8719 Personal history of other diseases of the digestive system: Secondary | ICD-10-CM | POA: Diagnosis not present

## 2018-10-24 DIAGNOSIS — I151 Hypertension secondary to other renal disorders: Secondary | ICD-10-CM | POA: Diagnosis present

## 2018-10-24 DIAGNOSIS — M1990 Unspecified osteoarthritis, unspecified site: Secondary | ICD-10-CM | POA: Diagnosis present

## 2018-10-24 DIAGNOSIS — Z794 Long term (current) use of insulin: Secondary | ICD-10-CM | POA: Diagnosis not present

## 2018-10-24 DIAGNOSIS — R0789 Other chest pain: Secondary | ICD-10-CM | POA: Diagnosis present

## 2018-10-24 DIAGNOSIS — E785 Hyperlipidemia, unspecified: Secondary | ICD-10-CM | POA: Diagnosis present

## 2018-10-24 HISTORY — DX: Essential (primary) hypertension: I10

## 2018-10-24 LAB — HEMOGLOBIN A1C
Hgb A1c MFr Bld: 6.3 % — ABNORMAL HIGH (ref 4.8–5.6)
Mean Plasma Glucose: 134.11 mg/dL

## 2018-10-24 LAB — GLUCOSE, CAPILLARY
Glucose-Capillary: 95 mg/dL (ref 70–99)
Glucose-Capillary: 77 mg/dL (ref 70–99)
Glucose-Capillary: 142 mg/dL — ABNORMAL HIGH (ref 70–99)

## 2018-10-24 LAB — MAGNESIUM: Magnesium: 2.1 mg/dL (ref 1.7–2.4)

## 2018-10-24 LAB — SARS CORONAVIRUS 2 BY RT PCR (HOSPITAL ORDER, PERFORMED IN ~~LOC~~ HOSPITAL LAB): SARS Coronavirus 2: NEGATIVE

## 2018-10-24 MED ORDER — MORPHINE SULFATE (PF) 4 MG/ML IV SOLN
4.0000 mg | Freq: Once | INTRAVENOUS | Status: AC
  Administered 2018-10-24: 07:00:00 4 mg via INTRAVENOUS
  Filled 2018-10-24: qty 1

## 2018-10-24 MED ORDER — INSULIN ASPART 100 UNIT/ML ~~LOC~~ SOLN: 0.0000 [IU] | Freq: Every day | SUBCUTANEOUS | Status: DC

## 2018-10-24 MED ORDER — IOHEXOL 350 MG/ML SOLN
100.0000 mL | Freq: Once | INTRAVENOUS | Status: AC | PRN
  Administered 2018-10-24: 100 mL via INTRAVENOUS

## 2018-10-24 MED ORDER — VANCOMYCIN HCL 10 G IV SOLR
2500.0000 mg | Freq: Once | INTRAVENOUS | Status: AC
  Administered 2018-10-24: 14:00:00 2500 mg via INTRAVENOUS
  Filled 2018-10-24: qty 2500

## 2018-10-24 MED ORDER — ENOXAPARIN SODIUM 40 MG/0.4ML ~~LOC~~ SOLN: 40.0000 mg | SUBCUTANEOUS | Status: DC

## 2018-10-24 MED ORDER — LACTATED RINGERS IV BOLUS
1000.0000 mL | Freq: Once | INTRAVENOUS | Status: AC
  Administered 2018-10-24: 09:00:00 1000 mL via INTRAVENOUS

## 2018-10-24 MED ORDER — INSULIN ASPART 100 UNIT/ML ~~LOC~~ SOLN
0.0000 [IU] | Freq: Three times a day (TID) | SUBCUTANEOUS | Status: DC
  Administered 2018-11-06: 1 [IU] via SUBCUTANEOUS

## 2018-10-24 MED ORDER — ENOXAPARIN SODIUM 100 MG/ML ~~LOC~~ SOLN
85.0000 mg | SUBCUTANEOUS | Status: DC
  Administered 2018-10-24 – 2018-11-05 (×13): 85 mg via SUBCUTANEOUS
  Filled 2018-10-24 (×14): qty 0.85

## 2018-10-24 MED ORDER — HYDROMORPHONE HCL 1 MG/ML IJ SOLN
0.5000 mg | Freq: Four times a day (QID) | INTRAMUSCULAR | Status: DC | PRN
  Administered 2018-10-24 – 2018-11-01 (×6): 0.5 mg via INTRAVENOUS
  Filled 2018-10-24 (×6): qty 1

## 2018-10-24 MED ORDER — SODIUM CHLORIDE 0.9% FLUSH
3.0000 mL | Freq: Two times a day (BID) | INTRAVENOUS | Status: DC
  Administered 2018-10-25 – 2018-11-06 (×15): 3 mL via INTRAVENOUS

## 2018-10-24 MED ORDER — HYDROCHLOROTHIAZIDE 25 MG PO TABS
25.0000 mg | ORAL_TABLET | Freq: Every day | ORAL | Status: DC
  Administered 2018-10-24 – 2018-11-06 (×14): 25 mg via ORAL
  Filled 2018-10-24 (×14): qty 1

## 2018-10-24 MED ORDER — ACETAMINOPHEN 650 MG RE SUPP: 650.0000 mg | Freq: Four times a day (QID) | RECTAL | Status: DC | PRN

## 2018-10-24 MED ORDER — ACETAMINOPHEN 325 MG PO TABS
650.0000 mg | ORAL_TABLET | Freq: Four times a day (QID) | ORAL | Status: DC | PRN
  Administered 2018-11-05 (×2): 650 mg via ORAL
  Filled 2018-10-24 (×3): qty 2

## 2018-10-24 MED ORDER — IRBESARTAN 300 MG PO TABS
300.0000 mg | ORAL_TABLET | Freq: Every day | ORAL | Status: DC
  Administered 2018-10-24 – 2018-11-06 (×14): 300 mg via ORAL
  Filled 2018-10-24 (×14): qty 1

## 2018-10-24 MED ORDER — VALSARTAN-HYDROCHLOROTHIAZIDE 320-25 MG PO TABS: 1.0000 | ORAL_TABLET | Freq: Every day | ORAL | Status: DC

## 2018-10-24 MED ORDER — ONDANSETRON HCL 4 MG/2ML IJ SOLN
4.0000 mg | Freq: Once | INTRAMUSCULAR | Status: AC
  Administered 2018-10-24: 4 mg via INTRAVENOUS
  Filled 2018-10-24: qty 2

## 2018-10-24 MED ORDER — HYDROCODONE-ACETAMINOPHEN 5-325 MG PO TABS
1.0000 | ORAL_TABLET | Freq: Four times a day (QID) | ORAL | Status: DC | PRN
  Administered 2018-10-24 – 2018-11-06 (×20): 1 via ORAL
  Filled 2018-10-24 (×22): qty 1

## 2018-10-24 MED ORDER — VANCOMYCIN HCL 10 G IV SOLR
2000.0000 mg | INTRAVENOUS | Status: DC
  Administered 2018-10-25: 2000 mg via INTRAVENOUS
  Filled 2018-10-24 (×2): qty 2000

## 2018-10-24 MED ORDER — ACETAMINOPHEN 500 MG PO TABS
1000.0000 mg | ORAL_TABLET | Freq: Once | ORAL | Status: AC
  Administered 2018-10-24: 07:00:00 1000 mg via ORAL
  Filled 2018-10-24: qty 2

## 2018-10-24 MED ORDER — FENTANYL CITRATE (PF) 100 MCG/2ML IJ SOLN
50.0000 ug | Freq: Once | INTRAMUSCULAR | Status: AC
  Administered 2018-10-24: 50 ug via INTRAVENOUS
  Filled 2018-10-24: qty 2

## 2018-10-24 NOTE — Progress Notes (Signed)
Pharmacy Antibiotic Note  Tiffany Bullock is a 58 y.o. female admitted on 10/23/2018 with septic arthritis Tiffany Bullock joint.  Pharmacy has been consulted for Vancomcyin dosing.  CC/HPI: right superior chest pain radiating to R shoulder. CT suggested a septic Tiffany Bullock joint   PMH: anemia, obesity, back surgery, bowel resection 2003, HTN,  borderline DM  ID: Septic arthritis of the Tiffany Bullock joint. Tmax 100.1. WBC 11.3, Scr 1.14  Vanco 9/1>>  Vancomycin 2000 mg IV Q 24 hrs. Goal AUC 400-550. Expected AUC: 473.1 SCr used: 1.14  Plan: Vancomycin 2500mg  IV load Vanco 2000mg  IV q 24h     Height: 5\' 7"  (170.2 cm) Weight: (!) 381 lb 6.3 oz (173 kg) IBW/kg (Calculated) : 61.6  Temp (24hrs), Avg:99.8 F (37.7 C), Min:99.2 F (37.3 C), Max:100.1 F (37.8 C)  Recent Labs  Lab 10/23/18 2212  WBC 11.3*  CREATININE 1.14*  LATICACIDVEN 1.3    Estimated Creatinine Clearance: 92.4 mL/min (A) (by C-G formula based on SCr of 1.14 mg/dL (H)).    No Known Allergies   Tiffany Bullock S. Tiffany Bullock, PharmD, BCPS Clinical Staff Pharmacist Tiffany Bullock 10/24/2018 1:15 PM

## 2018-10-24 NOTE — ED Notes (Signed)
Pt ambulated to the bathroom.  

## 2018-10-24 NOTE — Plan of Care (Signed)

## 2018-10-24 NOTE — Consult Note (Addendum)
[address]       [place] [address]             [phone]        Prescilla Facer [place] Medical Record [id number] Date of Birth: February 23, 1960   Referring: Santos-Sanchez Primary Care: [facility]  Chief Complaint:    Chief Complaint  Patient presents with   Shoulder Pain    History of Present Illness:      Ms. Borsch is a 58 yo morbidly obese AA female with history of HTN and prediabetes.  She presented to the ED today for evaluation of of right shoulder pain.  The patient was in her usual state of health when on sudden she developed sharp pain in her right clavicle.  The patient was unable to move her shoulder completely.  She is having difficulty with her everyday daily activities.  The patient attempted ibuprofen, tylenol, and ointments without relief. The paint persisted and actually got worse as the day progressed.  The patient suffered a fall recently and experienced shoulder discomfort which resolved. She does do strenuous work.  She states that she uses her shoulder frequently with having to rotate a lever on her truck to raise the lower the bed to empty.  She did notice she had to perform a little more work to get the bed moved.  She denies fevers, chills, sweats, chest pain, N/V, or cold symptoms.  She underwent CTA of the chest was obtained.  This showed no evidence of PE, but was concerning for possible septic arthritis.  She was admitted for further care.  She was started on Vancomycin.  Orthopedic consult was obtained and felt surgical indication was not indicated.  They felt IR could perform and aspirate to send for analysis.  Cardiothoracic surgery consultation has been requested.       Current Activity/ Functional Status: Patient is independent with mobility/ambulation, transfers, ADL's, IADL's.    Past Medical History:  Diagnosis Date   Anemia    Blood transfusion without reported diagnosis Age 28   Essential  hypertension 10/24/2018   Obesity (BMI 35.0-39.9 without comorbidity)    Personal history of colonic polyp - adenoma 11/05/2013   Sleep apnea    uses cpap    Past Surgical History:  Procedure Laterality Date   back surgery  Age 84   Fatty tumor removed   BOWEL RESECTION  2003   Bowel Obstruction Surgery   COLONOSCOPY N/A 11/05/2013   Procedure: COLONOSCOPY;  Surgeon: [name], MD;  Location: [facility];  Service: Endoscopy;  Laterality: N/A;   TUBAL LIGATION  15 years ago    Social History   Tobacco Use  Smoking Status Never Smoker  Smokeless Tobacco Never Used    Social History   Substance and Sexual Activity  Alcohol Use Yes   Alcohol/week: 1.0 standard drinks   Types: 1 Standard drinks or equivalent per week     No Known Allergies  Current Facility-Administered Medications  Medication Dose Route Frequency Provider Last Rate Last Dose   acetaminophen (TYLENOL) tablet 650 mg  650 mg Oral Q6H PRN Santos-Sanchez, Merlene Morse, MD       Or   acetaminophen (TYLENOL) suppository 650 mg  650 mg Rectal Q6H PRN Santos-Sanchez, Merlene Morse, MD       enoxaparin (LOVENOX) injection 85 mg  85 mg Subcutaneous Q24H Axel Filler, MD       irbesartan (AVAPRO) tablet 300 mg  300 mg Oral Daily Axel Filler, MD   300 mg at 10/24/18 1150   And   hydrochlorothiazide (HYDRODIURIL) tablet 25 mg  25 mg Oral Daily Axel Filler, MD   25 mg at 10/24/18 1151   HYDROcodone-acetaminophen (NORCO/VICODIN) 5-325 MG per tablet 1 tablet  1 tablet Oral Q6H PRN [name], MD   1 tablet at 10/24/18 1151   HYDROmorphone (DILAUDID) injection 0.5 mg  0.5 mg Intravenous Q6H PRN [name], MD       insulin aspart (novoLOG) injection 0-5 Units  0-5 Units Subcutaneous QHS Santos-Sanchez, Idalys, MD       insulin aspart (novoLOG) injection 0-9 Units  0-9 Units Subcutaneous TID WC Santos-Sanchez, Idalys, MD       sodium chloride flush (NS) 0.9 %  injection 3 mL  3 mL Intravenous Q12H [name], MD       [START ON 10/25/2018] vancomycin (VANCOCIN) 2,000 mg in sodium chloride 0.9 % 500 mL IVPB  2,000 mg Intravenous Q24H Robertson, Crystal S, RPH       vancomycin (VANCOCIN) 2,500 mg in sodium chloride 0.9 % 500 mL IVPB  2,500 mg Intravenous Once Karren Cobble, RPH 250 mL/hr at 10/24/18 1413 2,500 mg at 10/24/18 1413    Medications Prior to Admission  Medication Sig Dispense Refill Last Dose   cholecalciferol (VITAMIN D3) 25 MCG (1000 UT) tablet Take 1,000 Units by mouth daily.   10/23/2018 at Unknown time   ferrous sulfate 325 (65 FE) MG tablet Take 325 mg by mouth daily with breakfast.   10/23/2018 at Unknown time   metFORMIN (GLUCOPHAGE) 500 MG tablet Take 1 tablet by mouth daily.   10/23/2018 at Unknown time   valsartan-hydrochlorothiazide (DIOVAN-HCT) 320-25 MG tablet Take 1 tablet by mouth daily.   10/23/2018 at Unknown time    Family History  Adopted: Yes     Review of Systems:   Pertinent items are noted in HPI.     Cardiac Review of Systems: Y or  [    ]= no  Chest Pain [  N  ]  Resting SOB [  N ] Exertional SOB  [  ]  Orthopnea [ N ]   Pedal Edema [ N  ]    Palpitations [ N ] Syncope  Aqua.Slicker  ]   Presyncope [ N  ]  General Review of Systems: [Y] = yes [  ]=no Constitional: recent weight change [  ]; anorexia [  ]; fatigue [  ]; nausea [ N ]; night sweats Aqua.Slicker  ]; fever [ Y ]; or chills [ N ]                                                               Dental: Last Dentist visit:   Eye : blurred vision [  ]; diplopia [   ]; vision changes [  ];  Amaurosis fugax[  ]; Resp: cough Aqua.Slicker  ];  wheezing[ N ];  hemoptysis[  ]; shortness of breath[  ]; paroxysmal nocturnal dyspnea[  ]; dyspnea on exertion[ N ]; or orthopnea[  ];  GI:  gallstones[  ], vomiting[  ];  dysphagia[  ]; melena[  ];  hematochezia [  ]; heartburn[  ];   Hx of  Colonoscopy[  ]; GU: kidney stones [  ]; hematuria[  ];   dysuria [  ];  nocturia[   ];  history of     obstruction [  ]; urinary frequency [  ]             Skin: rash, swelling[N  ];, hair loss[  ];  peripheral edema[N  ];  or itching[  ]; Musculosketetal: myalgias[  ];  joint swelling[  ];  joint erythema[  ];  joint pain[Y  ];  back pain[  ];  Heme/Lymph: bruising[  ];  bleeding[  ];  anemia[  ];  Neuro: TIA[  ];  headaches[  ];  stroke[  ];  vertigo[  ];  seizures[  ];   paresthesias[  ];  difficulty walking[N  ];  Psych:depression[  ]; anxiety[  ];  Endocrine: diabetes[  ];  thyroid dysfunction[  ];  Physical Exam: BP 126/86 (BP Location: Right Wrist)    Pulse 91    Temp 98.7 F (37.1 C) (Oral)    Resp 18    Ht 5\' 7"  (1.702 m)    Wt (!) 173 kg    SpO2 98%    BMI 59.73 kg/m   General appearance: alert, cooperative and mildly obese Head: Normocephalic, without obvious abnormality, atraumatic Resp: clear to auscultation bilaterally Cardio: regular rate and rhythm GI: soft, non-tender; bowel sounds normal; no masses,  no organomegaly Extremities: Right shoulder area, mildly tender with ROM, no evidence of edema, erythema present Neurologic: Grossly normal  Right sternoclavicular area is tender but it is not warm, or erythematous   Diagnostic Studies & Laboratory data:     Recent Radiology Findings:   Dg Shoulder Right  Result Date: 10/23/2018 CLINICAL DATA:  Right shoulder pain and limited range of motion. No known injury. EXAM: RIGHT SHOULDER - 2+ VIEW COMPARISON:  None. FINDINGS: There is no evidence of fracture or dislocation. Suggestion of mild inferior glenoid osteophyte. Mild proliferative change at the acromioclavicular joint. Small soft tissue calcification in the region of the rotator cuff insertion. IMPRESSION: 1. No acute osseous abnormality.  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 2. Small soft tissue calcification in the region of the rotator cuff insertion may represent calcific tendinopathy. Electronically Signed   By: Keith Rake M.D.   On: 10/23/2018 23:00     Ct Angio Chest Pe W And/or Wo Contrast  Result Date: 10/24/2018 CLINICAL DATA:  Shortness of breath EXAM: CT ANGIOGRAPHY CHEST WITH CONTRAST TECHNIQUE: Multidetector CT imaging of the chest was performed using the standard protocol during bolus administration of intravenous contrast. Multiplanar CT image reconstructions and MIPs were obtained to evaluate the vascular anatomy. CONTRAST:  11mL OMNIPAQUE IOHEXOL 350 MG/ML SOLN COMPARISON:  Contemporary CT of the shoulder FINDINGS: Cardiovascular: Suboptimal opacification of the pulmonary arteries central pulmonary arterial contrast bolus measuring only 180 Hounsfield units. Furthermore there is extensive streak artifact from retained contrast in the superior vena cava as well as respiratory motion artifact and cardiac pulsation artifact. Combination of these findings limits detection of segmental and more distal pulmonary emboli. No large central embolus is seen in the pulmonary trunk or main pulmonary arteries. No central pulmonary arterial enlargement. No CT evidence of right heart strain. Atherosclerotic plaque within the normal caliber aorta. Normal heart size. No pericardial effusion. Mediastinum/Nodes: No enlarged mediastinal or axillary lymph nodes. The trachea and esophagus are unremarkable. The thyroid gland and thoracic inlet are free of significant abnormality. Lungs/Pleura: Lung volumes are diminished with bandlike basilar opacities likely  reflecting a combination of subsegmental atelectasis and/or scarring. More dependent ground-glass atelectasis is noted posteriorly. No consolidative process. No pneumothorax or effusion. Small amount of subpleural fat in the bases. Upper Abdomen: No acute abnormalities present in the visualized portions of the upper abdomen. Musculoskeletal: There is some abnormal sclerosis and erosive change at the right sternoclavicular joint with adjacent synovial thickening and surrounding phlegmonous change. Inflammatory  features appear to extend to the right first sternocostal joint as well (coronal 11/107). No other acute osseous abnormality is evident. No fracture or traumatic malalignment. Mild glenohumeral and acromioclavicular arthrosis is in the left shoulder. A right glenohumeral arthrosis is present as better detailed on dedicated CT of the shoulder. Insert Degen spine Review of the MIP images confirms the above findings. IMPRESSION: 1. Suboptimal opacification of the pulmonary arteries due to poor contrast bolus, motion artifact and streak from the retained contrast in the superior vena cava. This limits evaluation of the segmental and more distal pulmonary emboli. No large central pulmonary embolus, pulmonary artery enlargement or evidence of right heart strain. 2. Abnormal sclerosis and erosive change at the right sternoclavicular joint with adjacent synovial thickening and surrounding phlegmonous change. Inflammatory features appear to extend to the right first sternocostal joint as well. Findings are concerning for septic arthritis. 3. Aortic Atherosclerosis (ICD10-I70.0). These results were called by telephone at the time of interpretation on 10/24/2018 at 6:50 am to Dr. Pryor Curia , who verbally acknowledged these results. Electronically Signed   By: Lovena Le M.D.   On: 10/24/2018 06:57   Ct Shoulder Right Wo Contrast  Result Date: 10/24/2018 CLINICAL DATA:  Unable to elevate right shoulder. EXAM: CT OF THE UPPER RIGHT EXTREMITY WITH CONTRAST TECHNIQUE: Multidetector CT imaging of the upper right extremity was performed according to the standard protocol following intravenous contrast administration. COMPARISON:  None. CONTRAST:  177mL OMNIPAQUE IOHEXOL 350 MG/ML SOLN FINDINGS: Bones/Joint/Cartilage No acute fracture or traumatic malalignment. There is mild glenohumeral arthrosis. There is a small sclerotic bone island in the humeral head which appears seated within glenoid. Acromioclavicular alignment is  maintained albeit with mild arthrosis. No suspicious osseous lesion. Ligaments Suboptimally assessed by CT. Muscles and Tendons Muscle quality is age-appropriate. There is no abnormal atrophy or fatty infiltration of the muscle bellies. No abnormal retraction. No intramuscular hematoma or other acute muscular or ligamentous injury is identified. Soft tissues No soft tissue swelling. No sizeable joint effusion. Axillary soft tissues unremarkable. For details of the chest wall and included lung, see dedicated CT of the chest. IMPRESSION: 1. No acute osseous abnormality. 2. Mild glenohumeral and acromioclavicular arthrosis. 3. For details of the chest wall and included lung, see dedicated chest CT angiography. These results were called by telephone at the time of interpretation on 10/24/2018 at 6:30 am to Dr. Pryor Curia , who verbally acknowledged these results. Electronically Signed   By: Lovena Le M.D.   On: 10/24/2018 06:41     I have independently reviewed the above radiologic studies and discussed with the patient   Recent Lab Findings: Lab Results  Component Value Date   WBC 11.3 (H) 10/23/2018   HGB 12.0 10/23/2018   HCT 38.1 10/23/2018   PLT 293 10/23/2018   GLUCOSE 144 (H) 10/23/2018   CHOL 221 (H) 11/09/2012   TRIG 56 11/09/2012   HDL 77 11/09/2012   LDLCALC 133 (H) 11/09/2012   ALT 17 10/23/2018   AST 21 10/23/2018   NA 135 10/23/2018   K 3.9 10/23/2018   CL  99 10/23/2018   CREATININE 1.14 (H) 10/23/2018   BUN 18 10/23/2018   CO2 23 10/23/2018   HGBA1C 6.3 (H) 10/24/2018   Assessment / Plan:      I have seen the patient reviewed her history and reviewed the above radiographic findings.  The patient had a very low-grade temperature.  She has no other history of sepsis, or other sites of infection.  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  She notes the  most acute pain started Monday after she unloaded her truck trailer on Sunday afternoon.  I would agree with the previous orthopedic consultant, aspirate the joint, to help discern infectious versus other inflammatory causes for right sternoclavicular pain.  Currently does not warrant surgical debridement.   Jamarian Jacinto B Zanylah Hardie MD      [address] [place] [address] Office [phone]   [place]

## 2018-10-24 NOTE — ED Provider Notes (Signed)
TIME SEEN: 5:10 AM  CHIEF COMPLAINT: Right-sided chest pain, right shoulder pain, shortness of breath  HPI: Patient is a 58 year old morbidly obese female with history of hypertension who presents to the emergency department with complaints of 1 day of right-sided chest pain that radiates into the right shoulder that causes significant pain with any movement of the shoulder.  She is right-hand dominant.  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  She is not aware of any injury to her arm.  She states that she feels very short of breath because of the pain.  Temperature here of 99.9.  Was not aware of fever at home.  No cough.  No known exposures to COVID and she is not sure if she has been in a COVID hotspot.  No vomiting or diarrhea.  ROS: See HPI Constitutional: no fever  Eyes: no drainage  ENT: no runny nose   Cardiovascular:   chest pain  Resp:  SOB  GI: no vomiting GU: no dysuria Integumentary: no rash  Allergy: no hives  Musculoskeletal: no leg swelling  Neurological: no slurred speech ROS otherwise negative  PAST MEDICAL HISTORY/PAST SURGICAL HISTORY:  Past Medical History:  Diagnosis Date  . Anemia   . Blood transfusion without reported diagnosis Age 43  . Obesity (BMI 35.0-39.9 without comorbidity)   . Personal history of colonic polyp - adenoma 11/05/2013    MEDICATIONS:  Prior to Admission medications   Not on File    ALLERGIES:  No Known Allergies  SOCIAL HISTORY:  Social History   Tobacco Use  . Smoking status: Never Smoker  . Smokeless tobacco: Never Used  Substance Use Topics  . Alcohol use: Yes    Alcohol/week: 1.0 standard drinks    Types: 1 Standard drinks or equivalent per week    FAMILY HISTORY: Family History  Adopted: Yes    EXAM: BP (!) 163/79   Pulse 100   Temp 99.9 F (37.7 C) (Oral)   Resp 20   SpO2 99%  CONSTITUTIONAL: Alert and oriented and responds appropriately to questions.  Appears uncomfortable.   Morbidly obese. HEAD: Normocephalic EYES: Conjunctivae clear, pupils appear equal, EOMI ENT: normal nose; moist mucous membranes NECK: Supple, no meningismus, no nuchal rigidity, no LAD  CARD: RRR; S1 and S2 appreciated; no murmurs, no clicks, no rubs, no gallops RESP: Patient is tachypneic with respiratory rate in the upper 30s.  Breath sounds clear and equal bilaterally; no wheezes, no rhonchi, no rales, no hypoxia or respiratory distress, speaking full sentences ABD/GI: Normal bowel sounds; non-distended; soft, non-tender, no rebound, no guarding, no peritoneal signs, no hepatosplenomegaly BACK:  The back appears normal and is non-tender to palpation, there is no CVA tenderness EXT: Patient is tender to palpation over the right clavicle, right chest wall and right anterior shoulder.  Exam is limited secondary to morbid obesity.  She feels warm to touch all over but there is no significant redness or increased warmth to the right upper extremity.  She is a 2+ right radial pulse.  She is unable to actively range the right shoulder but I can passively move it with significant pain with lifting it above her head.  She has no axillary lymphadenopathy.  No abscess appreciated.  No sign of compartment syndrome.  Normal grip strength in the right hand.  Normal sensation in the right arm.  Overlying skin over right clavicle and right shoulder show no acute abnormality.  No erythema or warmth.  No induration or fluctuance.  No sign of abscess or cellulitis. SKIN: Normal color for age and race; warm; no rash NEURO: Moves all extremities equally PSYCH: The patient's mood and manner are appropriate. Grooming and personal hygiene are appropriate.  MEDICAL DECISION MAKING: Patient here with symptoms of right-sided chest pain and shoulder pain.  Difficult to examine her arm due to her morbid obesity.  X-ray obtained in triage shows degenerative changes and calcific tendinopathy.  Low suspicion for septic arthritis  but this is on the differential.  She does have a low-grade oral temperature here of 99.9.  Will check rectal temperature.  I am also concerned about PE given she is tachypneic and has history of long travel.  Will obtain CTA of her chest as I feel she is too high risk to use d-dimer.  Will give pain and nausea medicine.  Labs obtained in triage show mild leukocytosis with left shift.  Normal lactate.  Mildly elevated CRP and ESR.  Will also obtain EKG but low suspicion for ACS.  We will also swab for COVID.  ED PROGRESS: Discussed with radiology.  No joint effusion or significant abnormality noted on CT of the right shoulder.  CTA of the chest had poorly timed contrast bolus and is unable to rule out even segmental PE.  There is no saddle embolus.  Lungs otherwise appear clear without signs of infiltrate or edema.  Patient does have rectal temperature of 100.1.  Given Tylenol.  COVID swab pending.    Discussed again with radiologist who states that there is bone irregularity and inflammation at the right sternoclavicular joint that could represent septic arthritis.  This is exactly where the patient is tender.  Her COVID swab is negative.  Radiology states there is not a large fluid collection for arthrocentesis.  Will discuss with orthopedics on-call and medicine for admission.  7:13 AM  D/w Dr. Doreatha Martin with orthopedics who will review patient's imaging for further recommendations.  Patient does not have a primary care provider.  Medicine has been paged for admission.  Patient has been updated with plan.  Will hold on antibiotics at this time until imaging reviewed by orthopedics.  Blood cultures pending.   I reviewed all nursing notes, vitals, pertinent previous records, EKGs, lab and urine results, imaging (as available).    EKG Interpretation  Date/Time:  Tuesday October 24 2018 05:24:58 EDT Ventricular Rate:  98 PR Interval:    QRS Duration: 64 QT Interval:  404 QTC Calculation: 516 R  Axis:   82 Text Interpretation:  Sinus rhythm Prolonged PR interval Borderline T abnormalities, diffuse leads Prolonged QT interval No old tracing to compare Confirmed by Pryor Curia [phone]) on 10/24/2018 6:27:54 AM        Tiffany Bullock was evaluated in Emergency Department on 10/24/2018 for the symptoms described in the history of present illness. She was evaluated in the context of the global COVID-19 pandemic, which necessitated consideration that the patient might be at risk for infection with the SARS-CoV-2 virus that causes COVID-19. Institutional protocols and algorithms that pertain to the evaluation of patients at risk for COVID-19 are in a state of rapid change based on information released by regulatory bodies including the CDC and federal and state organizations. These policies and algorithms were followed during the patient's care in the ED.    CRITICAL CARE Performed by: Pryor Curia   Total critical care time: 65 minutes  Critical care time was exclusive of separately billable procedures and treating other  patients.  Critical care was necessary to treat or prevent imminent or life-threatening deterioration.  Critical care was time spent personally by me on the following activities: development of treatment plan with patient and/or surrogate as well as nursing, discussions with consultants, evaluation of patient's response to treatment, examination of patient, obtaining history from patient or surrogate, ordering and performing treatments and interventions, ordering and review of laboratory studies, ordering and review of radiographic studies, pulse oximetry and re-evaluation of patient's condition.    Jarone Ostergaard, Delice Bison, DO 10/24/18 [phone]

## 2018-10-24 NOTE — ED Notes (Signed)
ED TO INPATIENT HANDOFF REPORT  ED Nurse Name and Phone #:   S Name/Age/Gender Tiffany Bullock 58 y.o. female Room/Bed: 020C/020C  Code Status   Code Status: Full Code  Home/SNF/Other Home Patient oriented to: self, place, time and situation Is this baseline? Yes   Triage Complete: Triage complete  Chief Complaint R shoulder pain  Triage Note Pt here for eval of right shoulder pain that is worse with movement, states she cannot lift the arm. She has had the pain for 1 day. Denies shortness of breath or chest pain. Pt right collar bone is tender to palpation.    Allergies No Known Allergies  Level of Care/Admitting Diagnosis ED Disposition    ED Disposition Condition [facility] Area: [place] [[id number]]  Level of Care: Telemetry Medical [104]  Covid Evaluation: Confirmed COVID Negative  Diagnosis: Septic arthritis [facility]) [id number]  Admitting Physician: Axel Filler [phone]  Attending Physician: Axel Filler [id number]  Estimated length of stay: past midnight tomorrow  Certification:: I certify this patient will need inpatient services for at least 2 midnights  PT Class (Do Not Modify): Inpatient [101]  PT Acc Code (Do Not Modify): Private [1]       B Medical/Surgery History Past Medical History:  Diagnosis Date  . Anemia   . Blood transfusion without reported diagnosis Age 65  . Obesity (BMI 35.0-39.9 without comorbidity)   . Personal history of colonic polyp - adenoma 11/05/2013   Past Surgical History:  Procedure Laterality Date  . back surgery  Age 14   Fatty tumor removed  . BOWEL RESECTION  2003   Bowel Obstruction Surgery  . COLONOSCOPY N/A 11/05/2013   Procedure: COLONOSCOPY;  Surgeon: [name], MD;  Location: [facility]COPY;  Service: Endoscopy;  Laterality: N/A;  . TUBAL LIGATION  15 years ago     A IV Location/Drains/Wounds Patient Lines/Drains/Airways Status   Active  Line/Drains/Airways    Name:   Placement date:   Placement time:   Site:   Days:   Peripheral IV 10/24/18 Left Forearm   10/24/18    0533    Forearm   less than 1          Intake/Output Last 24 hours No intake or output data in the 24 hours ending 10/24/18 0912  Labs/Imaging Results for orders placed or performed during the hospital encounter of 10/23/18 (from the past 48 hour(s))  Comprehensive metabolic panel     Status: Abnormal   Collection Time: 10/23/18 10:12 PM  Result Value Ref Range   Sodium 135 135 - 145 mmol/L   Potassium 3.9 3.5 - 5.1 mmol/L   Chloride 99 98 - 111 mmol/L   CO2 23 22 - 32 mmol/L   Glucose, Bld 144 (H) 70 - 99 mg/dL   BUN 18 6 - 20 mg/dL   Creatinine, Ser 1.14 (H) 0.44 - 1.00 mg/dL   Calcium 9.9 8.9 - 10.3 mg/dL   Total Protein 8.0 6.5 - 8.1 g/dL   Albumin 4.2 3.5 - 5.0 g/dL   AST 21 15 - 41 U/L   ALT 17 0 - 44 U/L   Alkaline Phosphatase 90 38 - 126 U/L   Total Bilirubin 0.8 0.3 - 1.2 mg/dL   GFR calc non Af Amer 54 (L) >60 mL/min   GFR calc Af Amer >60 >60 mL/min   Anion gap 13 5 - 15    Comment: Performed at [facility], [address] [place], [place] [address]  CBC with Differential     Status: Abnormal   Collection Time: 10/23/18 10:12 PM  Result Value Ref Range   WBC 11.3 (H) 4.0 - 10.5 K/uL   RBC 4.58 3.87 - 5.11 MIL/uL   Hemoglobin 12.0 12.0 - 15.0 g/dL   HCT 38.1 36.0 - 46.0 %   MCV 83.2 80.0 - 100.0 fL   MCH 26.2 26.0 - 34.0 pg   MCHC 31.5 30.0 - 36.0 g/dL   RDW 15.2 11.5 - 15.5 %   Platelets 293 150 - 400 K/uL   nRBC 0.0 0.0 - 0.2 %   Neutrophils Relative % 76 %   Neutro Abs 8.6 (H) 1.7 - 7.7 K/uL   Lymphocytes Relative 15 %   Lymphs Abs 1.7 0.7 - 4.0 K/uL   Monocytes Relative 7 %   Monocytes Absolute 0.8 0.1 - 1.0 K/uL   Eosinophils Relative 1 %   Eosinophils Absolute 0.1 0.0 - 0.5 K/uL   Basophils Relative 1 %   Basophils Absolute 0.1 0.0 - 0.1 K/uL   Immature Granulocytes 0 %   Abs Immature Granulocytes 0.03  0.00 - 0.07 K/uL    Comment: Performed at [facility], [address], [place], [place] [address]  C-reactive protein     Status: Abnormal   Collection Time: 10/23/18 10:12 PM  Result Value Ref Range   CRP 6.1 (H) <1.0 mg/dL    Comment: Performed at [facility] [address], [place], [place] [address]  Sedimentation rate     Status: Abnormal   Collection Time: 10/23/18 10:12 PM  Result Value Ref Range   Sed Rate 36 (H) 0 - 22 mm/hr    Comment: Performed at [facility] [address], [place], [place] [address]  Lactic acid, plasma     Status: None   Collection Time: 10/23/18 10:12 PM  Result Value Ref Range   Lactic Acid, Venous 1.3 0.5 - 1.9 mmol/L    Comment: Performed at [facility] [address], [place], [place] [address]  SARS Coronavirus 2 [facility] order, Performed in [facility] hospital lab) Nasopharyngeal Nasopharyngeal Swab     Status: None   Collection Time: 10/24/18  5:35 AM   Specimen: Nasopharyngeal Swab  Result Value Ref Range   SARS Coronavirus 2 NEGATIVE NEGATIVE    Comment: (NOTE) If result is NEGATIVE SARS-CoV-2 target nucleic acids are NOT DETECTED. The SARS-CoV-2 RNA is generally detectable in upper and lower  respiratory specimens during the acute phase of infection. The lowest  concentration of SARS-CoV-2 viral copies this assay can detect is 250  copies / mL. A negative result does not preclude SARS-CoV-2 infection  and should not be used as the sole basis for treatment or other  patient management decisions.  A negative result may occur with  improper specimen collection / handling, submission of specimen other  than nasopharyngeal swab, presence of viral mutation(s) within the  areas targeted by this assay, and inadequate number of viral copies  (<250 copies / mL). A negative result must be combined with clinical  observations, patient history, and epidemiological information. If result is POSITIVE SARS-CoV-2 target  nucleic acids are DETECTED. The SARS-CoV-2 RNA is generally detectable in upper and lower  respiratory specimens dur ing the acute phase of infection.  Positive  results are indicative of active infection with SARS-CoV-2.  Clinical  correlation with patient history and other diagnostic information is  necessary to determine patient infection status.  Positive results do  not rule out bacterial infection or co-infection with other viruses. If result is PRESUMPTIVE POSTIVE SARS-CoV-2 nucleic acids MAY BE PRESENT.   A presumptive positive result was obtained on the submitted specimen  and confirmed on repeat testing.  While 2019 novel coronavirus  (SARS-CoV-2) nucleic acids may be present in the submitted sample  additional confirmatory testing may be necessary for epidemiological  and / or clinical management purposes  to differentiate between  SARS-CoV-2 and other Sarbecovirus currently known to infect humans.  If clinically indicated additional testing with an alternate test  methodology [phone]) is advised. The SARS-CoV-2 RNA is generally  detectable in upper and lower respiratory sp ecimens during the acute  phase of infection. The expected result is Negative. Fact Sheet for Patients:  StrictlyIdeas.no Fact Sheet for Healthcare Providers: BankingDealers.co.za This test is not yet approved or cleared by the [place] FDA and has been authorized for detection and/or diagnosis of SARS-CoV-2 by FDA under an Emergency Use Authorization (EUA).  This EUA will remain in effect (meaning this test can be used) for the duration of the COVID-19 declaration under Section 564(b)(1) of the Act, 21 U.S.C. section 360bbb-3(b)(1), unless the authorization is terminated or revoked sooner. Performed at [facility], [place] [address] [place], [place] [address]    Dg Shoulder Right  Result Date: 10/23/2018 CLINICAL DATA:  Right shoulder pain  and limited range of motion. No known injury. EXAM: RIGHT SHOULDER - 2+ VIEW COMPARISON:  None. FINDINGS: There is no evidence of fracture or dislocation. Suggestion of mild inferior glenoid osteophyte. Mild proliferative change at the acromioclavicular joint. Small soft tissue calcification in the region of the rotator cuff insertion. IMPRESSION: 1. No acute osseous abnormality.  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 2. Small soft tissue calcification in the region of the rotator cuff insertion may represent calcific tendinopathy. Electronically Signed   By: Keith Rake M.D.   On: 10/23/2018 23:00   Ct Angio Chest Pe W And/or Wo Contrast  Result Date: 10/24/2018 CLINICAL DATA:  Shortness of breath EXAM: CT ANGIOGRAPHY CHEST WITH CONTRAST TECHNIQUE: Multidetector CT imaging of the chest was performed using the standard protocol during bolus administration of intravenous contrast. Multiplanar CT image reconstructions and MIPs were obtained to evaluate the vascular anatomy. CONTRAST:  135mL OMNIPAQUE IOHEXOL 350 MG/ML SOLN COMPARISON:  Contemporary CT of the shoulder FINDINGS: Cardiovascular: Suboptimal opacification of the pulmonary arteries central pulmonary arterial contrast bolus measuring only 180 Hounsfield units. Furthermore there is extensive streak artifact from retained contrast in the superior vena cava as well as respiratory motion artifact and cardiac pulsation artifact. Combination of these findings limits detection of segmental and more distal pulmonary emboli. No large central embolus is seen in the pulmonary trunk or main pulmonary arteries. No central pulmonary arterial enlargement. No CT evidence of right heart strain. Atherosclerotic plaque within the normal caliber aorta. Normal heart size. No pericardial effusion. Mediastinum/Nodes: No enlarged mediastinal or axillary lymph nodes. The trachea and esophagus are unremarkable. The thyroid gland and thoracic inlet are free of significant abnormality.  Lungs/Pleura: Lung volumes are diminished with bandlike basilar opacities likely reflecting a combination of subsegmental atelectasis and/or scarring. More dependent ground-glass atelectasis is noted posteriorly. No consolidative process. No pneumothorax or effusion. Small amount of subpleural fat in the bases. Upper Abdomen: No acute abnormalities present in the visualized portions of the upper abdomen. Musculoskeletal: There is some abnormal sclerosis and erosive change at the right sternoclavicular  joint with adjacent synovial thickening and surrounding phlegmonous change. Inflammatory features appear to extend to the right first sternocostal joint as well (coronal 11/107). No other acute osseous abnormality is evident. No fracture or traumatic malalignment. Mild glenohumeral and acromioclavicular arthrosis is in the left shoulder. A right glenohumeral arthrosis is present as better detailed on dedicated CT of the shoulder. Insert Degen spine Review of the MIP images confirms the above findings. IMPRESSION: 1. Suboptimal opacification of the pulmonary arteries due to poor contrast bolus, motion artifact and streak from the retained contrast in the superior vena cava. This limits evaluation of the segmental and more distal pulmonary emboli. No large central pulmonary embolus, pulmonary artery enlargement or evidence of right heart strain. 2. Abnormal sclerosis and erosive change at the right sternoclavicular joint with adjacent synovial thickening and surrounding phlegmonous change. Inflammatory features appear to extend to the right first sternocostal joint as well. Findings are concerning for septic arthritis. 3. Aortic Atherosclerosis (ICD10-I70.0). These results were called by telephone at the time of interpretation on 10/24/2018 at 6:50 am to Dr. Pryor Curia , who verbally acknowledged these results. Electronically Signed   By: Lovena Le M.D.   On: 10/24/2018 06:57   Ct Shoulder Right Wo  Contrast  Result Date: 10/24/2018 CLINICAL DATA:  Unable to elevate right shoulder. EXAM: CT OF THE UPPER RIGHT EXTREMITY WITH CONTRAST TECHNIQUE: Multidetector CT imaging of the upper right extremity was performed according to the standard protocol following intravenous contrast administration. COMPARISON:  None. CONTRAST:  134mL OMNIPAQUE IOHEXOL 350 MG/ML SOLN FINDINGS: Bones/Joint/Cartilage No acute fracture or traumatic malalignment. There is mild glenohumeral arthrosis. There is a small sclerotic bone island in the humeral head which appears seated within glenoid. Acromioclavicular alignment is maintained albeit with mild arthrosis. No suspicious osseous lesion. Ligaments Suboptimally assessed by CT. Muscles and Tendons Muscle quality is age-appropriate. There is no abnormal atrophy or fatty infiltration of the muscle bellies. No abnormal retraction. No intramuscular hematoma or other acute muscular or ligamentous injury is identified. Soft tissues No soft tissue swelling. No sizeable joint effusion. Axillary soft tissues unremarkable. For details of the chest wall and included lung, see dedicated CT of the chest. IMPRESSION: 1. No acute osseous abnormality. 2. Mild glenohumeral and acromioclavicular arthrosis. 3. For details of the chest wall and included lung, see dedicated chest CT angiography. These results were called by telephone at the time of interpretation on 10/24/2018 at 6:30 am to Dr. Pryor Curia , who verbally acknowledged these results. Electronically Signed   By: Lovena Le M.D.   On: 10/24/2018 06:41    Pending Labs Unresulted Labs (From admission, onward)    Start     Ordered   10/25/18 [id number]  Basic metabolic panel  Tomorrow morning,   R     10/24/18 0902   10/25/18 0500  CBC  Tomorrow morning,   R     10/24/18 0902   10/24/18 0902  Magnesium  Add-on,   AD     10/24/18 0902   10/24/18 0901  HIV antibody (Routine Testing)  Add-on,   AD     10/24/18 0902   10/24/18 0901   Hemoglobin A1c  Add-on,   AD     10/24/18 0902   10/24/18 0651  Blood culture (routine x 2)  BLOOD CULTURE X 2,   STAT     10/24/18 0651          Vitals/Pain Today's Vitals   10/24/18 0800 10/24/18 0815 10/24/18 0830 10/24/18  0845  BP: 121/64 116/70 123/61 (!) 117/59  Pulse: 94 92 90   Resp: (!) 22 (!) 25 18 19   Temp:      TempSrc:      SpO2: 93% 98% 100%   PainSc:        Isolation Precautions No active isolations  Medications Medications  enoxaparin (LOVENOX) injection 40 mg (has no administration in time range)  sodium chloride flush (NS) 0.9 % injection 3 mL (has no administration in time range)  acetaminophen (TYLENOL) tablet 650 mg (has no administration in time range)    Or  acetaminophen (TYLENOL) suppository 650 mg (has no administration in time range)  insulin aspart (novoLOG) injection 0-9 Units (has no administration in time range)  insulin aspart (novoLOG) injection 0-5 Units (has no administration in time range)  HYDROcodone-acetaminophen (NORCO/VICODIN) 5-325 MG per tablet 1 tablet (has no administration in time range)  HYDROmorphone (DILAUDID) injection 0.5 mg (has no administration in time range)  fentaNYL (SUBLIMAZE) injection 50 mcg (50 mcg Intravenous Given 10/24/18 0544)  ondansetron (ZOFRAN) injection 4 mg (4 mg Intravenous Given 10/24/18 0545)  acetaminophen (TYLENOL) tablet 1,000 mg (1,000 mg Oral Given 10/24/18 0631)  iohexol (OMNIPAQUE) 350 MG/ML injection 100 mL (100 mLs Intravenous Contrast Given 10/24/18 0552)  morphine 4 MG/ML injection 4 mg (4 mg Intravenous Given 10/24/18 0703)  lactated ringers bolus 1,000 mL (1,000 mLs Intravenous New Bag/Given 10/24/18 0909)    Mobility walks Low fall risk   Focused Assessments Cardiac Assessment Handoff:  Cardiac Rhythm: Normal sinus rhythm No results found for: CKTOTAL, CKMB, CKMBINDEX, TROPONINI No results found for: DDIMER Does the Patient currently have chest pain? No      R Recommendations: See  Admitting Provider Note  Report given to:   Additional Notes: .

## 2018-10-24 NOTE — H&P (Signed)
Date: 10/24/2018               Patient Name:  Tiffany Bullock MRN: [id number]  DOB: 1960/10/05 Age / Sex: 58 y.o., female   PCP: [facility]         Medical Service: Internal Medicine Teaching Service         Attending Physician: Dr. Evette Doffing, Mallie Mussel, *    First Contact: Dr. Benjamine Mola Pager: [phone]  Second Contact: Dr. Berline Lopes  Pager: [phone]       After Hours (After 5p/  First Contact Pager: [phone]  weekends / holidays): Second Contact Pager: [phone]   Chief Complaint: Right shoulder pain   History of Present Illness:  Tiffany Bullock is a 58 year-old female with morbid obesity, HTN, and ?pre-diabetes who presented to the ED for evaluation of R clavicular pain.  She was in her usual state of health until yesterday morning when she began to experience a sharp pain in her right clavicle. This was associated with decreased range of motion of R shoulder. She is having difficulty combing her hair, cooking, and driving.  She tried ibuprofen, Tylenol, and several ointments for pain without relief.  Her pain continued to worsen throughout the day which prompted her to come to the ED last night. She denies recent trauma, but recalls falling about 3 months ago while getting out of her truck which resulted in a fractured lower tooth that required intervention and a root canal.  No shoulder trauma at that time. She denies any infectious-type symptoms after her dental procedure. She does not have any recent hospitalizations, surgeries, or other trauma. No rashes or abrasions, placement of indwelling catheters or peripheral IV lines recently. She denies use of injectable drugs. Overall has been doing well until yesterday. She denies fever, chills, HA, vision changes, chest pain, shortness of breath, abdominal pain, N/V, urinary symptoms, changes in bowel movements, or LE edema.   ED course: Patient was found to have a mild low grate temperature at 100.1, hypertensive  182/93, tachycardic 115, RR 16, saturating well on room air. Blood work remarkable for mild leukocytosis of 11.3, Cr 1.14, ESR 36, and CRP 6.1. COVID negative. Blood cultures collected. She received several pain medications: morphine, Tylenol, and fentanyl.    Meds:  Current Meds  Medication Sig   cholecalciferol (VITAMIN D3) 25 MCG (1000 UT) tablet Take 1,000 Units by mouth daily.   ferrous sulfate 325 (65 FE) MG tablet Take 325 mg by mouth daily with breakfast.   metFORMIN (GLUCOPHAGE) 500 MG tablet Take 1 tablet by mouth daily.   valsartan-hydrochlorothiazide (DIOVAN-HCT) 320-25 MG tablet Take 1 tablet by mouth daily.     Allergies: Allergies as of 10/23/2018   (No Known Allergies)   Past Medical History:  Diagnosis Date   Anemia    Blood transfusion without reported diagnosis Age 77   Essential hypertension 10/24/2018   Obesity (BMI 35.0-39.9 without comorbidity)    Personal history of colonic polyp - adenoma 11/05/2013    Family History: Family history is unknown, patient is adopted.    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 She lived in [place]. She drives mostly out of state and is usually away from home for 2-3 months at a time. She usually drives with her daughter. She reports drinking two 20 oz beers at night to help her sleep. Does not have a history of withdrawals. She has never smoked, used illicit substances, or taken prescription medications  that were not prescribed to her.    Review of Systems: A complete ROS was negative except as per HPI.   Physical Exam: Blood pressure (!) 147/90, pulse 92, temperature 99.8 F (37.7 C), temperature source Oral, resp. rate 18, SpO2 100 %.   General: well-appearing female, well-developed, morbidly obese, laying down in bed in no acute distress  HENT: NCAT neck supple and FROM, OP clear without exudates or erythema, poor dentition with gingivitis on both upper and lower gums  Eyes: anicteric sclera,  PERRL Cardiac: regular rate and rhythm, nl S1/S2, no murmurs, rubs or gallops  Pulm: CTAB, no wheezes or crackles, no increased work of breathing  Abd: soft, NTND, bowel sounds are normoactive Neuro: A&Ox3, no sensory or motor deficits noted  MSK: decreased active ROM of R shoulder secondary to clavicular pain, full ROM on passive movement though this elicits pain as well. Pain is worse on external rotation. There is exquisite tenderness over [place] joint that radiates along mid clavicle. No R shoulder pain on palpation. L shoulder is unremarkable with normal ROM on both passive and active ROM.  Ext: warm and well perfused, without peripheral edema noted. Radial and DP pulses 2+ bilaterally  Derm: no rashes, abrasions, or lesions noted. No erythema or warmth over R [place] joint.     EKG: personally reviewed my interpretation is - normal sinus rhythm, prolonged PR and QT intervals, no axis deviation, no signs of acute ischemia, RA dilation   CTA chest: Limited study due to poor contrast bolus, motion artifact and streak from retained contrast.  1. No large central PE, pulmonary artery enlargement or evidence of R heat strain  2. Abnormal sclerosis and erosive changed at the R [place] joint with adjacent synovial thickening and surrounding phelgmonous change. Inflammatory features appear to extend to the R first sternocostal joint as well.   CT shoulder: Mild glenohumeral and acromioclavicular arthrosis. No acute osseous abnormalities.    Assessment & Plan by Problem: Principal Problem:   Septic arthritis of right [place] joint ([place]) Active Problems:   AKI (acute kidney injury) ([place])   Essential hypertension   # Septic arthritis of right [place] joint: Patient presents with acute onset of R clavicular pain and was found to have sclerosis and erosive changes at the R [facility] joint concerning for septic arthritis.  She also has a low-grade fever, a mild leukocytosis, and elevated inflammatory markers. The source of the  infection is unclear at this time, but I suspect she may be bacteremic possibly from a dental procedure she had 2-3 months ago after she sustained a fall that resulted in a fractured  lower tooth. I spoke with the radiologist who stated he did not appreciate any fluid collection for joint aspiration. Ortho was consulted in the ED. No surgical indication at this time.They plan to consult IR for joint aspiration.  - Ortho consulted, appreciate recommendations  - Follow up blood cultures  - Will hold off on antibiotics for now pending IR recommendations. If no plan for aspiration, will plan to start vancomycin to cover for GPCs  - Norco 5-325 mg q6h PRN for pain, Dilaudid 0.5 mg q6h PRN for breakthrough pain   # AKI # HTN   Patient denies history of chronic kidney disease. Cr 1.14 on admission, no prior test results to compare. She appears euvolemic on exam, though may be volume down in the setting of insensible losses from fever. Will give 1L LR and reassess renal function in AM. Takes valsartan-HCTZ at  home for  HTN.  - 1L bolus LR  - Resume valsartan-HCTZ 320-25 mg tomorrow    # Pre-DM vs T2DM: Patient states she takes metformin to prevent diabetes. She denies history of T2DM. Does not check her sugars at home, this is hard for her since she is usually "on the road." No A1c documented in chart.  - Follow up Hgb A1c - Hold metformin  - SSI-S and HS scale    Diet: HH/CM, NPO at MN in case there is plan for surgical intervention  Fluids: 1L LR bolus  VTE ppx: SQ enoxaparin  Code status: FULL CODE, confirmed on admission    Dispo: Admit patient to Inpatient with expected length of stay greater than 2 midnights.  SignedWelford Roche, MD 10/24/2018, 10:47 AM  Pager: [phone]

## 2018-10-24 NOTE — Consult Note (Signed)
Reason for Consult:Right septic [place] joint  Referring Physician: D Mckinney [name] is an 58 y.o. female.  HPI: Tiffany Bullock was in her usual state of health when she started having right superior chest pain. This progressed and began to radiate to the right shoulder. It got bad enough that she was unable to use her arm. She came to the hospital for evaluation and CT suggested a septic [place] joint and orthopedic surgery was consulted. She is RHD. She denies fevers, chills, sweats, N/V, prior hx/o similar, or recent illnesses or infections.  Past Medical History:  Diagnosis Date  . Anemia   . Blood transfusion without reported diagnosis Age 51  . Obesity (BMI 35.0-39.9 without comorbidity)   . Personal history of colonic polyp - adenoma 11/05/2013    Past Surgical History:  Procedure Laterality Date  . back surgery  Age 30   Fatty tumor removed  . BOWEL RESECTION  2003   Bowel Obstruction Surgery  . COLONOSCOPY N/A 11/05/2013   Procedure: COLONOSCOPY;  Surgeon: [name], MD;  Location: [facility];  Service: Endoscopy;  Laterality: N/A;  . TUBAL LIGATION  15 years ago    Family History  Adopted: Yes    Social History:  reports that she has never smoked. She has never used smokeless tobacco. She reports current alcohol use of about 1.0 standard drinks of alcohol per week. She reports that she does not use drugs.  Allergies: No Known Allergies  Medications: I have reviewed the patient's current medications.  Results for orders placed or performed during the hospital encounter of 10/23/18 (from the past 48 hour(s))  Comprehensive metabolic panel     Status: Abnormal   Collection Time: 10/23/18 10:12 PM  Result Value Ref Range   Sodium 135 135 - 145 mmol/L   Potassium 3.9 3.5 - 5.1 mmol/L   Chloride 99 98 - 111 mmol/L   CO2 23 22 - 32 mmol/L   Glucose, Bld 144 (H) 70 - 99 mg/dL   BUN 18 6 - 20 mg/dL   Creatinine, Ser 1.14 (H) 0.44 - 1.00 mg/dL   Calcium 9.9 8.9 - 10.3  mg/dL   Total Protein 8.0 6.5 - 8.1 g/dL   Albumin 4.2 3.5 - 5.0 g/dL   AST 21 15 - 41 U/L   ALT 17 0 - 44 U/L   Alkaline Phosphatase 90 38 - 126 U/L   Total Bilirubin 0.8 0.3 - 1.2 mg/dL   GFR calc non Af Amer 54 (L) >60 mL/min   GFR calc Af Amer >60 >60 mL/min   Anion gap 13 5 - 15    Comment: Performed at [facility], [address], [place], [place] [address]  CBC with Differential     Status: Abnormal   Collection Time: 10/23/18 10:12 PM  Result Value Ref Range   WBC 11.3 (H) 4.0 - 10.5 K/uL   RBC 4.58 3.87 - 5.11 MIL/uL   Hemoglobin 12.0 12.0 - 15.0 g/dL   HCT 38.1 36.0 - 46.0 %   MCV 83.2 80.0 - 100.0 fL   MCH 26.2 26.0 - 34.0 pg   MCHC 31.5 30.0 - 36.0 g/dL   RDW 15.2 11.5 - 15.5 %   Platelets 293 150 - 400 K/uL   nRBC 0.0 0.0 - 0.2 %   Neutrophils Relative % 76 %   Neutro Abs 8.6 (H) 1.7 - 7.7 K/uL   Lymphocytes Relative 15 %   Lymphs Abs 1.7 0.7 - 4.0  K/uL   Monocytes Relative 7 %   Monocytes Absolute 0.8 0.1 - 1.0 K/uL   Eosinophils Relative 1 %   Eosinophils Absolute 0.1 0.0 - 0.5 K/uL   Basophils Relative 1 %   Basophils Absolute 0.1 0.0 - 0.1 K/uL   Immature Granulocytes 0 %   Abs Immature Granulocytes 0.03 0.00 - 0.07 K/uL    Comment: Performed at [facility] [address] [place], [place] [address]  C-reactive protein     Status: Abnormal   Collection Time: 10/23/18 10:12 PM  Result Value Ref Range   CRP 6.1 (H) <1.0 mg/dL    Comment: Performed at [facility] [address] [place], [place] [address]  Sedimentation rate     Status: Abnormal   Collection Time: 10/23/18 10:12 PM  Result Value Ref Range   Sed Rate 36 (H) 0 - 22 mm/hr    Comment: Performed at [facility] [address], [place], [place] [address]  Lactic acid, plasma     Status: None   Collection Time: 10/23/18 10:12 PM  Result Value Ref Range   Lactic Acid, Venous 1.3 0.5 - 1.9 mmol/L    Comment: Performed at [facility] [address],  [place],  [address]  SARS Coronavirus 2 [facility] order, Performed in [facility] hospital lab) Nasopharyngeal Nasopharyngeal Swab     Status: None   Collection Time: 10/24/18  5:35 AM   Specimen: Nasopharyngeal Swab  Result Value Ref Range   SARS Coronavirus 2 NEGATIVE NEGATIVE    Comment: (NOTE) If result is NEGATIVE SARS-CoV-2 target nucleic acids are NOT DETECTED. The SARS-CoV-2 RNA is generally detectable in upper and lower  respiratory specimens during the acute phase of infection. The lowest  concentration of SARS-CoV-2 viral copies this assay can detect is 250  copies / mL. A negative result does not preclude SARS-CoV-2 infection  and should not be used as the sole basis for treatment or other  patient management decisions.  A negative result may occur with  improper specimen collection / handling, submission of specimen other  than nasopharyngeal swab, presence of viral mutation(s) within the  areas targeted by this assay, and inadequate number of viral copies  (<250 copies / mL). A negative result must be combined with clinical  observations, patient history, and epidemiological information. If result is POSITIVE SARS-CoV-2 target nucleic acids are DETECTED. The SARS-CoV-2 RNA is generally detectable in upper and lower  respiratory specimens dur ing the acute phase of infection.  Positive  results are indicative of active infection with SARS-CoV-2.  Clinical  correlation with patient history and other diagnostic information is  necessary to determine patient infection status.  Positive results do  not rule out bacterial infection or co-infection with other viruses. If result is PRESUMPTIVE POSTIVE SARS-CoV-2 nucleic acids MAY BE PRESENT.   A presumptive positive result was obtained on the submitted specimen  and confirmed on repeat testing.  While 2019 novel coronavirus  (SARS-CoV-2) nucleic acids may be present in the submitted sample  additional confirmatory testing may  be necessary for epidemiological  and / or clinical management purposes  to differentiate between  SARS-CoV-2 and other Sarbecovirus currently known to infect humans.  If clinically indicated additional testing with an alternate test  methodology [phone]) is advised. The SARS-CoV-2 RNA is generally  detectable in upper and lower respiratory sp ecimens during the acute  phase of infection. The expected result is Negative. Fact Sheet  for Patients:  StrictlyIdeas.no Fact Sheet for Healthcare Providers: BankingDealers.co.za This test is not yet approved or cleared by the [place] FDA and has been authorized for detection and/or diagnosis of SARS-CoV-2 by FDA under an Emergency Use Authorization (EUA).  This EUA will remain in effect (meaning this test can be used) for the duration of the COVID-19 declaration under Section 564(b)(1) of the Act, 21 U.S.C. section 360bbb-3(b)(1), unless the authorization is terminated or revoked sooner. Performed at [facility], [address], [place], [place] [address]     Dg Shoulder Right  Result Date: 10/23/2018 CLINICAL DATA:  Right shoulder pain and limited range of motion. No known injury. EXAM: RIGHT SHOULDER - 2+ VIEW COMPARISON:  None. FINDINGS: There is no evidence of fracture or dislocation. Suggestion of mild inferior glenoid osteophyte. Mild proliferative change at the acromioclavicular joint. Small soft tissue calcification in the region of the rotator cuff insertion. IMPRESSION: 1. No acute osseous abnormality.  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 2. Small soft tissue calcification in the region of the rotator cuff insertion may represent calcific tendinopathy. Electronically Signed   By: Keith Rake M.D.   On: 10/23/2018 23:00   Ct Angio Chest Pe W And/or Wo Contrast  Result Date: 10/24/2018 CLINICAL DATA:  Shortness of breath EXAM: CT ANGIOGRAPHY CHEST WITH CONTRAST TECHNIQUE:  Multidetector CT imaging of the chest was performed using the standard protocol during bolus administration of intravenous contrast. Multiplanar CT image reconstructions and MIPs were obtained to evaluate the vascular anatomy. CONTRAST:  157mL OMNIPAQUE IOHEXOL 350 MG/ML SOLN COMPARISON:  Contemporary CT of the shoulder FINDINGS: Cardiovascular: Suboptimal opacification of the pulmonary arteries central pulmonary arterial contrast bolus measuring only 180 Hounsfield units. Furthermore there is extensive streak artifact from retained contrast in the superior vena cava as well as respiratory motion artifact and cardiac pulsation artifact. Combination of these findings limits detection of segmental and more distal pulmonary emboli. No large central embolus is seen in the pulmonary trunk or main pulmonary arteries. No central pulmonary arterial enlargement. No CT evidence of right heart strain. Atherosclerotic plaque within the normal caliber aorta. Normal heart size. No pericardial effusion. Mediastinum/Nodes: No enlarged mediastinal or axillary lymph nodes. The trachea and esophagus are unremarkable. The thyroid gland and thoracic inlet are free of significant abnormality. Lungs/Pleura: Lung volumes are diminished with bandlike basilar opacities likely reflecting a combination of subsegmental atelectasis and/or scarring. More dependent ground-glass atelectasis is noted posteriorly. No consolidative process. No pneumothorax or effusion. Small amount of subpleural fat in the bases. Upper Abdomen: No acute abnormalities present in the visualized portions of the upper abdomen. Musculoskeletal: There is some abnormal sclerosis and erosive change at the right sternoclavicular joint with adjacent synovial thickening and surrounding phlegmonous change. Inflammatory features appear to extend to the right first sternocostal joint as well (coronal 11/107). No other acute osseous abnormality is evident. No fracture or traumatic  malalignment. Mild glenohumeral and acromioclavicular arthrosis is in the left shoulder. A right glenohumeral arthrosis is present as better detailed on dedicated CT of the shoulder. Insert Degen spine Review of the MIP images confirms the above findings. IMPRESSION: 1. Suboptimal opacification of the pulmonary arteries due to poor contrast bolus, motion artifact and streak from the retained contrast in the superior vena cava. This limits evaluation of the segmental and more distal pulmonary emboli. No large central pulmonary embolus, pulmonary artery enlargement or evidence of right heart strain. 2. Abnormal sclerosis and erosive change at the right sternoclavicular joint with adjacent synovial  thickening and surrounding phlegmonous change. Inflammatory features appear to extend to the right first sternocostal joint as well. Findings are concerning for septic arthritis. 3. Aortic Atherosclerosis (ICD10-I70.0). These results were called by telephone at the time of interpretation on 10/24/2018 at 6:50 am to Dr. Pryor Curia , who verbally acknowledged these results. Electronically Signed   By: Lovena Le M.D.   On: 10/24/2018 06:57   Ct Shoulder Right Wo Contrast  Result Date: 10/24/2018 CLINICAL DATA:  Unable to elevate right shoulder. EXAM: CT OF THE UPPER RIGHT EXTREMITY WITH CONTRAST TECHNIQUE: Multidetector CT imaging of the upper right extremity was performed according to the standard protocol following intravenous contrast administration. COMPARISON:  None. CONTRAST:  177mL OMNIPAQUE IOHEXOL 350 MG/ML SOLN FINDINGS: Bones/Joint/Cartilage No acute fracture or traumatic malalignment. There is mild glenohumeral arthrosis. There is a small sclerotic bone island in the humeral head which appears seated within glenoid. Acromioclavicular alignment is maintained albeit with mild arthrosis. No suspicious osseous lesion. Ligaments Suboptimally assessed by CT. Muscles and Tendons Muscle quality is age-appropriate.  There is no abnormal atrophy or fatty infiltration of the muscle bellies. No abnormal retraction. No intramuscular hematoma or other acute muscular or ligamentous injury is identified. Soft tissues No soft tissue swelling. No sizeable joint effusion. Axillary soft tissues unremarkable. For details of the chest wall and included lung, see dedicated CT of the chest. IMPRESSION: 1. No acute osseous abnormality. 2. Mild glenohumeral and acromioclavicular arthrosis. 3. For details of the chest wall and included lung, see dedicated chest CT angiography. These results were called by telephone at the time of interpretation on 10/24/2018 at 6:30 am to Dr. Pryor Curia , who verbally acknowledged these results. Electronically Signed   By: Lovena Le M.D.   On: 10/24/2018 06:41    Review of Systems  Constitutional: Positive for fever. Negative for chills and weight loss.  HENT: Negative for ear discharge, ear pain, hearing loss and tinnitus.   Eyes: Negative for blurred vision, double vision, photophobia and pain.  Respiratory: Negative for cough, sputum production and shortness of breath.   Cardiovascular: Negative for chest pain.  Gastrointestinal: Negative for abdominal pain, nausea and vomiting.  Genitourinary: Negative for dysuria, flank pain, frequency and urgency.  Musculoskeletal: Positive for joint pain (Right [place] joint). Negative for back pain, falls, myalgias and neck pain.  Neurological: Negative for dizziness, tingling, sensory change, focal weakness, loss of consciousness and headaches.  Endo/Heme/Allergies: Does not bruise/bleed easily.  Psychiatric/Behavioral: Negative for depression, memory loss and substance abuse. The patient is not nervous/anxious.    Blood pressure (!) 117/59, pulse 90, temperature 100.1 F (37.8 C), temperature source Oral, resp. rate 19, SpO2 100 %. Physical Exam  Constitutional: She appears well-developed and well-nourished. No distress.  HENT:  Head: Normocephalic  and atraumatic.  Eyes: Conjunctivae are normal. Right eye exhibits no discharge. Left eye exhibits no discharge. No scleral icterus.  Neck: Normal range of motion.  Cardiovascular: Normal rate and regular rhythm.  Respiratory: Effort normal. No respiratory distress.  Musculoskeletal:     Comments: Right shoulder, elbow, wrist, digits- no skin wounds, TTP  joint, mild referred pain to [facility] joint with clav manipulation, no instability, no blocks to motion  Sens  Ax/R/M/U intact  Mot   Ax/ R/ PIN/ M/ AIN/ U intact  Rad 2+  Neurological: She is alert.  Skin: Skin is warm and dry. She is not diaphoretic.  Psychiatric: She has a normal mood and affect. Her behavior is normal.  Assessment/Plan: Right [place] arthritis -- Will ask IR to try and tap to help guide therapy. No surgical indication here. Multiple medical problems including HTN, hypocalcemia, and borderline DM -- per primary service    Lisette Abu, PA-C Orthopedic Surgery [phone] 10/24/2018, 9:16 AM

## 2018-10-25 ENCOUNTER — Inpatient Hospital Stay (HOSPITAL_COMMUNITY): Payer: BC Managed Care – PPO | Admitting: Certified Registered Nurse Anesthetist

## 2018-10-25 ENCOUNTER — Inpatient Hospital Stay (HOSPITAL_COMMUNITY): Payer: BC Managed Care – PPO

## 2018-10-25 ENCOUNTER — Encounter (HOSPITAL_COMMUNITY): Payer: Self-pay | Admitting: Certified Registered"

## 2018-10-25 ENCOUNTER — Encounter (HOSPITAL_COMMUNITY)
Admission: EM | Disposition: A | Discharge status: Home Health | Payer: Self-pay | Source: Home / Self Care | Attending: Student in an Organized Health Care Education/Training Program

## 2018-10-25 DIAGNOSIS — B9562 Methicillin resistant Staphylococcus aureus infection as the cause of diseases classified elsewhere: Secondary | ICD-10-CM

## 2018-10-25 DIAGNOSIS — R7881 Bacteremia: Secondary | ICD-10-CM

## 2018-10-25 DIAGNOSIS — R7303 Prediabetes: Secondary | ICD-10-CM

## 2018-10-25 DIAGNOSIS — A4102 Sepsis due to Methicillin resistant Staphylococcus aureus: Principal | ICD-10-CM

## 2018-10-25 DIAGNOSIS — D72829 Elevated white blood cell count, unspecified: Secondary | ICD-10-CM

## 2018-10-25 DIAGNOSIS — Z9851 Tubal ligation status: Secondary | ICD-10-CM

## 2018-10-25 DIAGNOSIS — E785 Hyperlipidemia, unspecified: Secondary | ICD-10-CM

## 2018-10-25 DIAGNOSIS — M009 Pyogenic arthritis, unspecified: Secondary | ICD-10-CM

## 2018-10-25 DIAGNOSIS — M00011 Staphylococcal arthritis, right shoulder: Secondary | ICD-10-CM

## 2018-10-25 DIAGNOSIS — R011 Cardiac murmur, unspecified: Secondary | ICD-10-CM

## 2018-10-25 DIAGNOSIS — I10 Essential (primary) hypertension: Secondary | ICD-10-CM

## 2018-10-25 DIAGNOSIS — Z978 Presence of other specified devices: Secondary | ICD-10-CM

## 2018-10-25 DIAGNOSIS — E669 Obesity, unspecified: Secondary | ICD-10-CM

## 2018-10-25 HISTORY — PX: STERNAL WOUND DEBRIDEMENT: SHX1058

## 2018-10-25 HISTORY — PX: TEE WITHOUT CARDIOVERSION: SHX5443

## 2018-10-25 HISTORY — DX: Methicillin resistant Staphylococcus aureus infection as the cause of diseases classified elsewhere: B95.62

## 2018-10-25 HISTORY — DX: Bacteremia: R78.81

## 2018-10-25 LAB — BLOOD CULTURE ID PANEL (REFLEXED)

## 2018-10-25 LAB — COMPREHENSIVE METABOLIC PANEL
ALT: 22 U/L (ref 0–44)
AST: 24 U/L (ref 15–41)
Albumin: 3.5 g/dL (ref 3.5–5.0)
Alkaline Phosphatase: 79 U/L (ref 38–126)
Anion gap: 12 (ref 5–15)
BUN: 14 mg/dL (ref 6–20)
CO2: 26 mmol/L (ref 22–32)
Calcium: 9.1 mg/dL (ref 8.9–10.3)
Chloride: 99 mmol/L (ref 98–111)
Creatinine, Ser: 0.96 mg/dL (ref 0.44–1.00)
GFR calc Af Amer: >60 mL/min (ref >60)
GFR calc non Af Amer: >60 mL/min (ref >60)
Glucose, Bld: 122 mg/dL — ABNORMAL HIGH (ref 70–99)
Potassium: 3.6 mmol/L (ref 3.5–5.1)
Sodium: 137 mmol/L (ref 135–145)
Total Bilirubin: 1 mg/dL (ref 0.3–1.2)
Total Protein: 7.4 g/dL (ref 6.5–8.1)

## 2018-10-25 LAB — BLOOD GAS, ARTERIAL
Acid-Base Excess: 4.4 mmol/L — ABNORMAL HIGH (ref 0.0–2.0)
Bicarbonate: 28.1 mmol/L — ABNORMAL HIGH (ref 20.0–28.0)
Drawn by: 51185
FIO2: 21
O2 Saturation: 89.7 %
Patient temperature: 98.6
pCO2 arterial: 39.6 mmHg (ref 32.0–48.0)
pH, Arterial: 7.465 — ABNORMAL HIGH (ref 7.350–7.450)
pO2, Arterial: 55.3 mmHg — ABNORMAL LOW (ref 83.0–108.0)

## 2018-10-25 LAB — CBC
HCT: 34.8 % — ABNORMAL LOW (ref 36.0–46.0)
HCT: 35.2 % — ABNORMAL LOW (ref 36.0–46.0)
Hemoglobin: 10.7 g/dL — ABNORMAL LOW (ref 12.0–15.0)
Hemoglobin: 11.1 g/dL — ABNORMAL LOW (ref 12.0–15.0)
MCH: 25.9 pg — ABNORMAL LOW (ref 26.0–34.0)
MCH: 26.4 pg (ref 26.0–34.0)
MCHC: 30.7 g/dL (ref 30.0–36.0)
MCHC: 31.5 g/dL (ref 30.0–36.0)
MCV: 84.3 fL (ref 80.0–100.0)
MCV: 83.6 fL (ref 80.0–100.0)
Platelets: 265 10*3/uL (ref 150–400)
Platelets: 237 10*3/uL (ref 150–400)
RBC: 4.13 MIL/uL (ref 3.87–5.11)
RBC: 4.21 MIL/uL (ref 3.87–5.11)
RDW: 15.4 % (ref 11.5–15.5)
RDW: 15.3 % (ref 11.5–15.5)
WBC: 11.4 10*3/uL — ABNORMAL HIGH (ref 4.0–10.5)
WBC: 11.8 10*3/uL — ABNORMAL HIGH (ref 4.0–10.5)
nRBC: 0 % (ref 0.0–0.2)
nRBC: 0 % (ref 0.0–0.2)

## 2018-10-25 LAB — GLUCOSE, CAPILLARY
Glucose-Capillary: 117 mg/dL — ABNORMAL HIGH (ref 70–99)
Glucose-Capillary: 111 mg/dL — ABNORMAL HIGH (ref 70–99)
Glucose-Capillary: 82 mg/dL (ref 70–99)
Glucose-Capillary: 90 mg/dL (ref 70–99)
Glucose-Capillary: 98 mg/dL (ref 70–99)
Glucose-Capillary: 97 mg/dL (ref 70–99)
Glucose-Capillary: 131 mg/dL — ABNORMAL HIGH (ref 70–99)

## 2018-10-25 LAB — TYPE AND SCREEN
ABO/RH(D): B POS
Antibody Screen: NEGATIVE

## 2018-10-25 LAB — BASIC METABOLIC PANEL
Anion gap: 12 (ref 5–15)
BUN: 13 mg/dL (ref 6–20)
CO2: 25 mmol/L (ref 22–32)
Calcium: 9.2 mg/dL (ref 8.9–10.3)
Chloride: 99 mmol/L (ref 98–111)
Creatinine, Ser: 1.09 mg/dL — ABNORMAL HIGH (ref 0.44–1.00)
GFR calc Af Amer: >60 mL/min (ref >60)
GFR calc non Af Amer: 57 mL/min — ABNORMAL LOW (ref >60)
Glucose, Bld: 125 mg/dL — ABNORMAL HIGH (ref 70–99)
Potassium: 3.7 mmol/L (ref 3.5–5.1)
Sodium: 136 mmol/L (ref 135–145)

## 2018-10-25 LAB — PROTIME-INR
INR: 1.2 (ref 0.8–1.2)
Prothrombin Time: 15.4 seconds — ABNORMAL HIGH (ref 11.4–15.2)

## 2018-10-25 LAB — ECHOCARDIOGRAM COMPLETE
Height: 67 in
Weight: 6102.33 oz

## 2018-10-25 LAB — ABO/RH: ABO/RH(D): B POS

## 2018-10-25 LAB — HIV ANTIBODY (ROUTINE TESTING W REFLEX): HIV Screen 4th Generation wRfx: NONREACTIVE

## 2018-10-25 LAB — SURGICAL PCR SCREEN
MRSA, PCR: NEGATIVE
Staphylococcus aureus: NEGATIVE

## 2018-10-25 LAB — APTT: aPTT: 33 seconds (ref 24–36)

## 2018-10-25 SURGERY — DEBRIDEMENT, WOUND, STERNUM
Anesthesia: General | Laterality: Right

## 2018-10-25 MED ORDER — FENTANYL CITRATE (PF) 250 MCG/5ML IJ SOLN
INTRAMUSCULAR | Status: DC | PRN
  Administered 2018-10-25: 100 ug via INTRAVENOUS

## 2018-10-25 MED ORDER — PERFLUTREN LIPID MICROSPHERE
1.0000 mL | INTRAVENOUS | Status: AC | PRN
  Administered 2018-10-25: 09:00:00 2 mL via INTRAVENOUS
  Filled 2018-10-25: qty 10

## 2018-10-25 MED ORDER — SUCCINYLCHOLINE CHLORIDE 200 MG/10ML IV SOSY
PREFILLED_SYRINGE | INTRAVENOUS | Status: DC | PRN
  Administered 2018-10-25: 160 mg via INTRAVENOUS

## 2018-10-25 MED ORDER — PROPOFOL 10 MG/ML IV BOLUS
INTRAVENOUS | Status: AC
  Filled 2018-10-25: qty 20

## 2018-10-25 MED ORDER — LIDOCAINE 2% (20 MG/ML) 5 ML SYRINGE
INTRAMUSCULAR | Status: DC | PRN
  Administered 2018-10-25: 80 mg via INTRAVENOUS

## 2018-10-25 MED ORDER — SODIUM CHLORIDE 0.9 % IV SOLN
INTRAVENOUS | Status: DC | PRN
  Administered 2018-10-25: 50 ug/min via INTRAVENOUS

## 2018-10-25 MED ORDER — SODIUM CHLORIDE 0.9 % IR SOLN
Status: DC | PRN
  Administered 2018-10-25: 3000 mL

## 2018-10-25 MED ORDER — FENTANYL CITRATE (PF) 250 MCG/5ML IJ SOLN
INTRAMUSCULAR | Status: AC
  Filled 2018-10-25: qty 5

## 2018-10-25 MED ORDER — SUGAMMADEX SODIUM 200 MG/2ML IV SOLN
INTRAVENOUS | Status: DC | PRN
  Administered 2018-10-25: 300 mg via INTRAVENOUS

## 2018-10-25 MED ORDER — SODIUM CHLORIDE 0.9 % IV SOLN: INTRAVENOUS | Status: DC

## 2018-10-25 MED ORDER — MIDAZOLAM HCL 2 MG/2ML IJ SOLN
INTRAMUSCULAR | Status: AC
  Filled 2018-10-25: qty 2

## 2018-10-25 MED ORDER — FENTANYL CITRATE (PF) 100 MCG/2ML IJ SOLN: 25.0000 ug | INTRAMUSCULAR | Status: DC | PRN

## 2018-10-25 MED ORDER — MIDAZOLAM HCL 5 MG/5ML IJ SOLN
INTRAMUSCULAR | Status: DC | PRN
  Administered 2018-10-25: 1 mg via INTRAVENOUS

## 2018-10-25 MED ORDER — PROPOFOL 10 MG/ML IV BOLUS
INTRAVENOUS | Status: DC | PRN
  Administered 2018-10-25: 40 mg via INTRAVENOUS
  Administered 2018-10-25: 160 mg via INTRAVENOUS

## 2018-10-25 MED ORDER — PHENYLEPHRINE 40 MCG/ML (10ML) SYRINGE FOR IV PUSH (FOR BLOOD PRESSURE SUPPORT)
PREFILLED_SYRINGE | INTRAVENOUS | Status: DC | PRN
  Administered 2018-10-25: 80 ug via INTRAVENOUS
  Administered 2018-10-25 (×2): 120 ug via INTRAVENOUS

## 2018-10-25 MED ORDER — ONDANSETRON HCL 4 MG/2ML IJ SOLN
INTRAMUSCULAR | Status: DC | PRN
  Administered 2018-10-25: 4 mg via INTRAVENOUS

## 2018-10-25 MED ORDER — ROCURONIUM BROMIDE 10 MG/ML (PF) SYRINGE
PREFILLED_SYRINGE | INTRAVENOUS | Status: DC | PRN
  Administered 2018-10-25: 50 mg via INTRAVENOUS

## 2018-10-25 MED ORDER — LACTATED RINGERS IV SOLN
INTRAVENOUS | Status: DC | PRN
  Administered 2018-10-25: 15:00:00 via INTRAVENOUS

## 2018-10-25 MED ORDER — DEXAMETHASONE SODIUM PHOSPHATE 10 MG/ML IJ SOLN
INTRAMUSCULAR | Status: DC | PRN
  Administered 2018-10-25: 5 mg via INTRAVENOUS

## 2018-10-25 SURGICAL SUPPLY — 64 items
APL SKNCLS STERI-STRIP NONHPOA (GAUZE/BANDAGES/DRESSINGS) ×2
ATTRACTOMAT 16X20 MAGNETIC DRP (DRAPES) ×3 IMPLANT
BAG DECANTER FOR FLEXI CONT (MISCELLANEOUS) ×3 IMPLANT
BENZOIN TINCTURE PRP APPL 2/3 (GAUZE/BANDAGES/DRESSINGS) ×1 IMPLANT
BLADE CLIPPER SURG (BLADE) ×3 IMPLANT
BLADE SURG 10 STRL SS (BLADE) ×6 IMPLANT
BNDG GAUZE ELAST 4 BULKY (GAUZE/BANDAGES/DRESSINGS) IMPLANT
CANISTER SUCT 3000ML PPV (MISCELLANEOUS) ×3 IMPLANT
CANISTER WOUND CARE 500ML ATS (WOUND CARE) ×1 IMPLANT
CATH THORACIC 28FR RT ANG (CATHETERS) IMPLANT
CATH THORACIC 36FR (CATHETERS) IMPLANT
CATH THORACIC 36FR RT ANG (CATHETERS) IMPLANT
CLIP VESOCCLUDE SM WIDE 24/CT (CLIP) IMPLANT
CONN Y 3/8X3/8X3/8  BEN (MISCELLANEOUS) ×1
CONN Y 3/8X3/8X3/8 BEN (MISCELLANEOUS) ×2 IMPLANT
CONT SPEC 4OZ CLIKSEAL STRL BL (MISCELLANEOUS) ×1 IMPLANT
COVER SURGICAL LIGHT HANDLE (MISCELLANEOUS) ×6 IMPLANT
COVER WAND RF STERILE (DRAPES) ×3 IMPLANT
DRAPE LAPAROSCOPIC ABDOMINAL (DRAPES) ×3 IMPLANT
DRAPE WARM FLUID 44X44 (DRAPES) IMPLANT
DRSG AQUACEL AG ADV 3.5X14 (GAUZE/BANDAGES/DRESSINGS) ×3 IMPLANT
DRSG PAD ABDOMINAL 8X10 ST (GAUZE/BANDAGES/DRESSINGS) ×9 IMPLANT
DRSG VAC ATS SM SENSATRAC (GAUZE/BANDAGES/DRESSINGS) ×1 IMPLANT
ELECT BLADE 4.0 EZ CLEAN MEGAD (MISCELLANEOUS) ×3
ELECT REM PT RETURN 9FT ADLT (ELECTROSURGICAL) ×3
ELECTRODE BLDE 4.0 EZ CLN MEGD (MISCELLANEOUS) IMPLANT
ELECTRODE REM PT RTRN 9FT ADLT (ELECTROSURGICAL) ×2 IMPLANT
GAUZE SPONGE 4X4 12PLY STRL (GAUZE/BANDAGES/DRESSINGS) ×3 IMPLANT
GAUZE XEROFORM 5X9 LF (GAUZE/BANDAGES/DRESSINGS) IMPLANT
GLOVE BIO SURGEON STRL SZ 6.5 (GLOVE) ×6 IMPLANT
GOWN STRL REUS W/ TWL LRG LVL3 (GOWN DISPOSABLE) ×8 IMPLANT
GOWN STRL REUS W/TWL LRG LVL3 (GOWN DISPOSABLE) ×12
HANDPIECE INTERPULSE COAX TIP (DISPOSABLE) ×3
HEMOSTAT POWDER SURGIFOAM 1G (HEMOSTASIS) IMPLANT
HEMOSTAT SURGICEL 2X14 (HEMOSTASIS) IMPLANT
KIT BASIN OR (CUSTOM PROCEDURE TRAY) ×3 IMPLANT
KIT SUCTION CATH 14FR (SUCTIONS) IMPLANT
KIT TURNOVER KIT B (KITS) ×3 IMPLANT
MARKER SKIN DUAL TIP RULER LAB (MISCELLANEOUS) IMPLANT
NS IRRIG 1000ML POUR BTL (IV SOLUTION) ×3 IMPLANT
PACK CHEST (CUSTOM PROCEDURE TRAY) ×3 IMPLANT
PAD ARMBOARD 7.5X6 YLW CONV (MISCELLANEOUS) ×6 IMPLANT
SET HNDPC FAN SPRY TIP SCT (DISPOSABLE) IMPLANT
SOL PREP POV-IOD 4OZ 10% (MISCELLANEOUS) IMPLANT
SPONGE LAP 18X18 RF (DISPOSABLE) ×3 IMPLANT
STAPLER VISISTAT 35W (STAPLE) IMPLANT
SUT ETHILON 3 0 FSL (SUTURE) IMPLANT
SUT PROLENE 6 0 C 1 30 (SUTURE) ×1 IMPLANT
SUT PROLENE 6 0 CC (SUTURE) ×1 IMPLANT
SUT STEEL 6MS V (SUTURE) IMPLANT
SUT STEEL STERNAL CCS#1 18IN (SUTURE) IMPLANT
SUT STEEL SZ 6 DBL 3X14 BALL (SUTURE) IMPLANT
SUT VIC AB 1 CTX 36 (SUTURE) ×6
SUT VIC AB 1 CTX36XBRD ANBCTR (SUTURE) ×4 IMPLANT
SUT VIC AB 2-0 CTX 27 (SUTURE) ×6 IMPLANT
SUT VIC AB 3-0 X1 27 (SUTURE) ×6 IMPLANT
SWAB COLLECTION DEVICE MRSA (MISCELLANEOUS) IMPLANT
SWAB CULTURE ESWAB REG 1ML (MISCELLANEOUS) ×1 IMPLANT
SYR 5ML LL (SYRINGE) IMPLANT
SYSTEM SAHARA CHEST DRAIN ATS (WOUND CARE) ×3 IMPLANT
TOWEL GREEN STERILE (TOWEL DISPOSABLE) ×3 IMPLANT
TOWEL GREEN STERILE FF (TOWEL DISPOSABLE) ×3 IMPLANT
TRAY FOLEY SLVR 14FR TEMP STAT (SET/KITS/TRAYS/PACK) IMPLANT
WATER STERILE IRR 1000ML POUR (IV SOLUTION) ×3 IMPLANT

## 2018-10-25 NOTE — Progress Notes (Signed)
  Subjective:  Patient reports continued pain over her right clavicle with movement.  Patient denies any new rashes, wounds, skin changes.  Patient reports having a root canal approximately 6 weeks ago.  Patient denies all recreational drug use including IV drugs.  Patient denies any surgeries other than a tubal ligation.  Denies history of cardiac surgery.  Patient reports that prior to admission she had not felt any fever or chills.   Objective:    Vital Signs (last 24 hours): Vitals:   10/24/18 1459 10/24/18 2047 10/25/18 0344 10/25/18 0853  BP: 126/86 (!) 148/86 (!) 102/49 (!) 141/75  Pulse: 91 95 88 89  Resp: 18 20 20 18   Temp: 98.7 F (37.1 C) 99.3 F (37.4 C) (!) 100.9 F (38.3 C) 98.7 F (37.1 C)  TempSrc: Oral Oral Oral Oral  SpO2: 98% 100% 93% 93%  Weight:      Height:         Physical Exam: General Alert and answers questions appropriately, no acute distress  Cardiac Regular rate and rhythm, no murmurs, rubs, or gallops  Pulmonary Clear to auscultation bilaterally without wheezes, rhonchi, or rales  Abdominal Soft, non-tender, without distention    Assessment/Plan:   Principal Problem:   Septic arthritis of right [place] joint (HCC) Active Problems:   AKI (acute kidney injury) ([place])   Essential hypertension  Ms. Ailstock is a 58 year old female with past medical history of hypertension who presents with right clavicular pain and found to have septic arthritis with MRSA bacteremia.  # Septic arthritis with MRSA bacteremia: Acute onset of right follicular pain and found to have sclerosis/erosive changes at the right sternoclavicular joint concerning for septic arthritis.  Patient with low-grade fever and mild leukocytosis.  Source may be from dental procedure 6 weeks ago.  TTE without evidence for endocarditis.  Patient remains on IV vancomycin and will require prolonged course of home IV antibiotics following discharge. * Dr. Servando Snare (cardiothoracic surgery)  evaluated patient and they plan for I&D today, patient on schedule at 16 * Spoke with Dr. [name] (cardiology) who will evaluate patient to determine appropriateness of TEE.  He thinks it will most likely be indicated. * After source control and repeat negative cultures we will plan for PICC placement for home antibiotics.   # AKI: Patient with creatinine of 1.14 on admission.  Patient given 1 L LR and creatinine has down trended and is now within normal limits.  # HTN: Resume patient's valsartan-hydrochlorothiazide 320-25 mg. # Prediabetes: Patient states she takes metformin to prevent diabetes.  Hemoglobin A1c of A1c.  Holding metformin and patient on sliding scale insulin.  Dispo: Anticipated discharge pending clinical improvement.  Jeanmarie Hubert, MD 10/25/2018, 10:48 AM Pager: [phone]

## 2018-10-25 NOTE — Consult Note (Signed)
Chief Complaint: Patient was seen in consultation today for image guided right sternoclavicular joint aspiration  Referring Physician(s): Hilbert Odor, PA-C  Supervising Physician: Markus Daft  Patient Status: [facility] - In-pt  History of Present Illness: Tiffany Bullock is a 58 y.o. female with a past medical history significant for anemia, sleep apnea and HTN who presented to [facility] ED 8/31 with complaints of right sided chest pain that radiates to the right shoulder. She was noted to be febrile at 100.1 with WBC 11.3 and elevated CRP/sedimentation rate. CT shoulder w/o contrast showed no acute osseous abnormality, mild glenohumoral and acromioclavicular arthrosis. CTA chest showed no evidence of PE however did not abnormal sclerosis and erosive change at the right sternoclavicular joint with adjacent synovial thickening and surrounding phlegmonous change with inflammatory features appearing to extend to the right first sternocostal joint as well - findings concerning for septic arthritis. She was admitted for further evaluation and management. Orthopedics was consulted and IR has been consulted for possible right sternoclavicular joint aspiration to further guide antibiotic therapy.   [name] states that her pain is about the same, somewhat improves with medication but she has trouble using her right arm for most tasks - she is able to hold a pen but has trouble writing. She denies any other complaints today. She states understanding of the requested procedure and wishes to proceed.  Past Medical History:  Diagnosis Date  . Anemia   . Blood transfusion without reported diagnosis Age 89  . Essential hypertension 10/24/2018  . Obesity (BMI 35.0-39.9 without comorbidity)   . Personal history of colonic polyp - adenoma 11/05/2013  . Sleep apnea    uses cpap    Past Surgical History:  Procedure Laterality Date  . back surgery  Age 29   Fatty tumor removed  . BOWEL RESECTION  2003   Bowel Obstruction Surgery  . COLONOSCOPY N/A 11/05/2013   Procedure: COLONOSCOPY;  Surgeon: [name], MD;  Location: [facility] ENDOSCOPY;  Service: Endoscopy;  Laterality: N/A;  . TUBAL LIGATION  15 years ago    Allergies: Patient has no known allergies.  Medications: Prior to Admission medications   Medication Sig Start Date End Date Taking? Authorizing Provider  cholecalciferol (VITAMIN D3) 25 MCG (1000 UT) tablet Take 1,000 Units by mouth daily.   Yes [provider]  ferrous sulfate 325 (65 FE) MG tablet Take 325 mg by mouth daily with breakfast.   Yes [provider]  metFORMIN (GLUCOPHAGE) 500 MG tablet Take 1 tablet by mouth daily.   Yes [provider]  valsartan-hydrochlorothiazide (DIOVAN-HCT) 320-25 MG tablet Take 1 tablet by mouth daily.   Yes [provider]     Family History  Adopted: Yes    Social History   Socioeconomic History  . Marital status: Single    Spouse name: Not on file  . Number of children: Not on file  . Years of education: Not on file  . Highest education level: Not on file  Occupational History  . Not on file  Social Needs  . Financial resource strain: Not on file  . Food insecurity    Worry: Not on file    Inability: Not on file  . Transportation needs    Medical: Not on file    Non-medical: Not on file  Tobacco Use  . Smoking status: Never Smoker  . Smokeless tobacco: Never Used  Substance and Sexual Activity  . Alcohol use: Yes    Alcohol/week: 1.0 standard  drinks    Types: 1 Standard drinks or equivalent per week  . Drug use: No  . Sexual activity: Not Currently    Partners: Male  Lifestyle  . Physical activity    Days per week: Not on file    Minutes per session: Not on file  . Stress: Not on file  Relationships  . Social Herbalist on phone: Not on file    Gets together: Not on file    Attends religious service: Not on file    Active member of club or organization: Not on  file    Attends meetings of clubs or organizations: Not on file    Relationship status: Not on file  Other Topics Concern  . Not on file  Social History Narrative  . Not on file     Review of Systems: A 12 point ROS discussed and pertinent positives are indicated in the HPI above.  All other systems are negative.  Review of Systems  Constitutional: Positive for diaphoresis and fatigue.  Respiratory: Negative for cough and shortness of breath.   Cardiovascular: Positive for chest pain (intermittent; right sided with movement).  Gastrointestinal: Negative for abdominal pain, blood in stool, diarrhea, nausea and vomiting.  Genitourinary: Negative for dysuria and hematuria.  Musculoskeletal: Positive for arthralgias.  Skin: Negative for wound.  Neurological: Negative for dizziness and headaches.    Vital Signs: BP (!) 102/49 (BP Location: Right Arm)   Pulse 88   Temp (!) 100.9 F (38.3 C) (Oral)   Resp 20   Ht 5\' 7"  (1.702 m)   Wt (!) 381 lb 6.3 oz (173 kg)   SpO2 93%   BMI 59.73 kg/m   Physical Exam Vitals signs and nursing note reviewed.  Constitutional:      General: She is not in acute distress.    Appearance: She is obese. She is diaphoretic.  HENT:     Head: Normocephalic.     Mouth/Throat:     Mouth: Mucous membranes are moist.     Pharynx: Oropharynx is clear. No oropharyngeal exudate or posterior oropharyngeal erythema.  Cardiovascular:     Rate and Rhythm: Normal rate and regular rhythm.  Pulmonary:     Effort: Pulmonary effort is normal.     Breath sounds: Normal breath sounds.  Abdominal:     General: Bowel sounds are normal. There is no distension.     Palpations: Abdomen is soft.     Tenderness: There is no abdominal tenderness.  Musculoskeletal:     Comments: Limited ROM in RUE  Skin:    General: Skin is warm.  Neurological:     Mental Status: She is alert and oriented to person, place, and time.  Psychiatric:        Mood and Affect: Mood  normal.        Behavior: Behavior normal.        Thought Content: Thought content normal.        Judgment: Judgment normal.      MD Evaluation Airway: WNL Heart: WNL Abdomen: WNL Chest/ Lungs: WNL ASA  Classification: 3 Mallampati/Airway Score: Two   Imaging: Dg Shoulder Right  Result Date: 10/23/2018 CLINICAL DATA:  Right shoulder pain and limited range of motion. No known injury. EXAM: RIGHT SHOULDER - 2+ VIEW COMPARISON:  None. FINDINGS: There is no evidence of fracture or dislocation. Suggestion of mild inferior glenoid osteophyte. Mild proliferative change at the acromioclavicular joint. Small soft tissue calcification in the region  of the rotator cuff insertion. IMPRESSION: 1. No acute osseous abnormality.  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 2. Small soft tissue calcification in the region of the rotator cuff insertion may represent calcific tendinopathy. Electronically Signed   By: Keith Rake M.D.   On: 10/23/2018 23:00   Ct Angio Chest Pe W And/or Wo Contrast  Result Date: 10/24/2018 CLINICAL DATA:  Shortness of breath EXAM: CT ANGIOGRAPHY CHEST WITH CONTRAST TECHNIQUE: Multidetector CT imaging of the chest was performed using the standard protocol during bolus administration of intravenous contrast. Multiplanar CT image reconstructions and MIPs were obtained to evaluate the vascular anatomy. CONTRAST:  118mL OMNIPAQUE IOHEXOL 350 MG/ML SOLN COMPARISON:  Contemporary CT of the shoulder FINDINGS: Cardiovascular: Suboptimal opacification of the pulmonary arteries central pulmonary arterial contrast bolus measuring only 180 Hounsfield units. Furthermore there is extensive streak artifact from retained contrast in the superior vena cava as well as respiratory motion artifact and cardiac pulsation artifact. Combination of these findings limits detection of segmental and more distal pulmonary emboli. No large central embolus is seen in the pulmonary trunk or main pulmonary arteries. No  central pulmonary arterial enlargement. No CT evidence of right heart strain. Atherosclerotic plaque within the normal caliber aorta. Normal heart size. No pericardial effusion. Mediastinum/Nodes: No enlarged mediastinal or axillary lymph nodes. The trachea and esophagus are unremarkable. The thyroid gland and thoracic inlet are free of significant abnormality. Lungs/Pleura: Lung volumes are diminished with bandlike basilar opacities likely reflecting a combination of subsegmental atelectasis and/or scarring. More dependent ground-glass atelectasis is noted posteriorly. No consolidative process. No pneumothorax or effusion. Small amount of subpleural fat in the bases. Upper Abdomen: No acute abnormalities present in the visualized portions of the upper abdomen. Musculoskeletal: There is some abnormal sclerosis and erosive change at the right sternoclavicular joint with adjacent synovial thickening and surrounding phlegmonous change. Inflammatory features appear to extend to the right first sternocostal joint as well (coronal 11/107). No other acute osseous abnormality is evident. No fracture or traumatic malalignment. Mild glenohumeral and acromioclavicular arthrosis is in the left shoulder. A right glenohumeral arthrosis is present as better detailed on dedicated CT of the shoulder. Insert Degen spine Review of the MIP images confirms the above findings. IMPRESSION: 1. Suboptimal opacification of the pulmonary arteries due to poor contrast bolus, motion artifact and streak from the retained contrast in the superior vena cava. This limits evaluation of the segmental and more distal pulmonary emboli. No large central pulmonary embolus, pulmonary artery enlargement or evidence of right heart strain. 2. Abnormal sclerosis and erosive change at the right sternoclavicular joint with adjacent synovial thickening and surrounding phlegmonous change. Inflammatory features appear to extend to the right first sternocostal  joint as well. Findings are concerning for septic arthritis. 3. Aortic Atherosclerosis (ICD10-I70.0). These results were called by telephone at the time of interpretation on 10/24/2018 at 6:50 am to Dr. Pryor Curia , who verbally acknowledged these results. Electronically Signed   By: Lovena Le M.D.   On: 10/24/2018 06:57   Ct Shoulder Right Wo Contrast  Result Date: 10/24/2018 CLINICAL DATA:  Unable to elevate right shoulder. EXAM: CT OF THE UPPER RIGHT EXTREMITY WITH CONTRAST TECHNIQUE: Multidetector CT imaging of the upper right extremity was performed according to the standard protocol following intravenous contrast administration. COMPARISON:  None. CONTRAST:  130mL OMNIPAQUE IOHEXOL 350 MG/ML SOLN FINDINGS: Bones/Joint/Cartilage No acute fracture or traumatic malalignment. There is mild glenohumeral arthrosis. There is a small sclerotic bone island in the humeral head which appears seated within  glenoid. Acromioclavicular alignment is maintained albeit with mild arthrosis. No suspicious osseous lesion. Ligaments Suboptimally assessed by CT. Muscles and Tendons Muscle quality is age-appropriate. There is no abnormal atrophy or fatty infiltration of the muscle bellies. No abnormal retraction. No intramuscular hematoma or other acute muscular or ligamentous injury is identified. Soft tissues No soft tissue swelling. No sizeable joint effusion. Axillary soft tissues unremarkable. For details of the chest wall and included lung, see dedicated CT of the chest. IMPRESSION: 1. No acute osseous abnormality. 2. Mild glenohumeral and acromioclavicular arthrosis. 3. For details of the chest wall and included lung, see dedicated chest CT angiography. These results were called by telephone at the time of interpretation on 10/24/2018 at 6:30 am to Dr. Pryor Curia , who verbally acknowledged these results. Electronically Signed   By: Lovena Le M.D.   On: 10/24/2018 06:41    Labs:  CBC: Recent Labs    10/23/18  2212 10/25/18 0353 10/25/18 0752  WBC 11.3* 11.4* 11.8*  HGB 12.0 10.7* 11.1*  HCT 38.1 34.8* 35.2*  PLT 293 265 237    COAGS: Recent Labs    10/25/18 0752  INR 1.2  APTT 33    BMP: Recent Labs    10/23/18 2212 10/25/18 0353  NA 135 136  K 3.9 3.7  CL 99 99  CO2 23 25  GLUCOSE 144* 125*  BUN 18 13  CALCIUM 9.9 9.2  CREATININE 1.14* 1.09*  GFRNONAA 54* 57*  GFRAA >60 >60    LIVER FUNCTION TESTS: Recent Labs    10/23/18 2212  BILITOT 0.8  AST 21  ALT 17  ALKPHOS 90  PROT 8.0  ALBUMIN 4.2    TUMOR MARKERS: No results for input(s): AFPTM, CEA, CA199, CHROMGRNA in the last 8760 hours.  Assessment and Plan:  58 y/o F with concern for septic arthritis of the right sternoclavicular joint - IR has been consulted for image guided aspiration of the right sternoclavicular joint. Patient has been reviewed by Dr. Anselm Pancoast who approves patient for procedure today.  Will plan for US guided aspiration today, if unable to obtain sample will consider CT guided aspiration however the CT scanner is out of service currently which may delay procedure.   Patient is febrile at 100.9, WBC 11.8, hgb 11.1, plt 237, INR 1.2. She has been NPO since midnight, last dose of Lovenox 9/1 at 2130.   Risks and benefits of image guided joint aspiration was discussed with the patient and/or patient's family including, but not limited to bleeding, infection, damage to adjacent structures or low yield requiring additional tests.  All of the questions were answered and there is agreement to proceed.  Consent signed and in chart.   Thank you for this interesting consult.  I greatly enjoyed meeting Hadil Demirjian and look forward to participating in their care.  A copy of this report was sent to the requesting provider on this date.  Electronically Signed: Joaquim Nam, PA-C 10/25/2018, 8:38 AM   I spent a total of 20 Minutes  in face to face in clinical consultation, greater than  50% of which was counseling/coordinating care for right sternoclavicular joint aspiration.

## 2018-10-25 NOTE — Progress Notes (Signed)
[facility] [address]       [place] [address]             [id number]                   Procedure(s) (LRB): I&D Right sternoclavicular joint (Right)  LOS: 1 day   Subjective: Feels better this am, right sternoclavicular joint unchanged , tender but no erythremia   Objective: Vital signs in last 24 hours: Patient Vitals for the past 24 hrs:  BP Temp Temp src Pulse Resp SpO2 Height Weight  10/25/18 0853 (!) 141/75 98.7 F (37.1 C) Oral 89 18 93 % -- --  10/25/18 0344 (!) 102/49 (!) 100.9 F (38.3 C) Oral 88 20 93 % -- --  10/24/18 2047 (!) 148/86 99.3 F (37.4 C) Oral 95 20 100 % -- --  10/24/18 1459 126/86 98.7 F (37.1 C) Oral 91 18 98 % -- --  10/24/18 1043 (!) 147/90 99.8 F (37.7 C) Oral 92 18 100 % 5\' 7"  (1.702 m) (!) 173 kg  10/24/18 1015 (!) 141/73 -- -- -- (!) 28 -- -- --  10/24/18 1000 (!) 142/64 -- -- -- (!) 23 -- -- --  10/24/18 0930 131/68 -- -- 87 (!) 29 97 % -- --    Filed Weights   10/24/18 1043  Weight: (!) 173 kg    Hemodynamic parameters for last 24 hours:    Intake/Output from previous day: 09/01 0701 - 09/02 0700 In: 1320 [P.O.:820; IV Piggyback:500] Out: -  Intake/Output this shift: No intake/output data recorded.  Scheduled Meds:  enoxaparin (LOVENOX) injection  85 mg Subcutaneous Q24H   irbesartan  300 mg Oral Daily   And   hydrochlorothiazide  25 mg Oral Daily   insulin aspart  0-5 Units Subcutaneous QHS   insulin aspart  0-9 Units Subcutaneous TID WC   sodium chloride flush  3 mL Intravenous Q12H   Continuous Infusions:  vancomycin     PRN Meds:.acetaminophen **OR** acetaminophen, HYDROcodone-acetaminophen, HYDROmorphone (DILAUDID) injection, perflutren lipid microspheres (DEFINITY) IV suspension  General appearance: alert, cooperative and no distress Neurologic: intact Heart: regular rate and rhythm, S1, S2 normal, no murmur, click, rub or gallop Lungs: clear to auscultation bilaterally Abdomen: soft,  non-tender; bowel sounds normal; no masses,  no organomegaly Extremities: extremities normal, atraumatic, no cyanosis or edema and except as noted right sternoclavicular    Lab Results: CBC: Recent Labs    10/25/18 0353 10/25/18 0752  WBC 11.4* 11.8*  HGB 10.7* 11.1*  HCT 34.8* 35.2*  PLT 265 237   BMET:  Recent Labs    10/25/18 0353 10/25/18 0752  NA 136 137  K 3.7 3.6  CL 99 99  CO2 25 26  GLUCOSE 125* 122*  BUN 13 14  CREATININE 1.09* 0.96  CALCIUM 9.2 9.1    PT/INR:  Recent Labs    10/25/18 0752  LABPROT 15.4*  INR 1.2   Recent Results (from the past 240 hour(s))  SARS Coronavirus 2 [facility] order, Performed in [facility] hospital lab) Nasopharyngeal Nasopharyngeal Swab     Status: None   Collection Time: 10/24/18  5:35 AM   Specimen: Nasopharyngeal Swab  Result Value Ref Range Status   SARS Coronavirus 2 NEGATIVE NEGATIVE Final    Comment: (NOTE) If result is NEGATIVE SARS-CoV-2 target nucleic acids are NOT DETECTED. The SARS-CoV-2 RNA is generally detectable in upper and lower  respiratory specimens during the acute phase of infection.  The lowest  concentration of SARS-CoV-2 viral copies this assay can detect is 250  copies / mL. A negative result does not preclude SARS-CoV-2 infection  and should not be used as the sole basis for treatment or other  patient management decisions.  A negative result may occur with  improper specimen collection / handling, submission of specimen other  than nasopharyngeal swab, presence of viral mutation(s) within the  areas targeted by this assay, and inadequate number of viral copies  (<250 copies / mL). A negative result must be combined with clinical  observations, patient history, and epidemiological information. If result is POSITIVE SARS-CoV-2 target nucleic acids are DETECTED. The SARS-CoV-2 RNA is generally detectable in upper and lower  respiratory specimens dur ing the acute phase of infection.  Positive   results are indicative of active infection with SARS-CoV-2.  Clinical  correlation with patient history and other diagnostic information is  necessary to determine patient infection status.  Positive results do  not rule out bacterial infection or co-infection with other viruses. If result is PRESUMPTIVE POSTIVE SARS-CoV-2 nucleic acids MAY BE PRESENT.   A presumptive positive result was obtained on the submitted specimen  and confirmed on repeat testing.  While 2019 novel coronavirus  (SARS-CoV-2) nucleic acids may be present in the submitted sample  additional confirmatory testing may be necessary for epidemiological  and / or clinical management purposes  to differentiate between  SARS-CoV-2 and other Sarbecovirus currently known to infect humans.  If clinically indicated additional testing with an alternate test  methodology [phone]) is advised. The SARS-CoV-2 RNA is generally  detectable in upper and lower respiratory sp ecimens during the acute  phase of infection. The expected result is Negative. Fact Sheet for Patients:  StrictlyIdeas.no Fact Sheet for Healthcare Providers: BankingDealers.co.za This test is not yet approved or cleared by the [place] FDA and has been authorized for detection and/or diagnosis of SARS-CoV-2 by FDA under an Emergency Use Authorization (EUA).  This EUA will remain in effect (meaning this test can be used) for the duration of the COVID-19 declaration under Section 564(b)(1) of the Act, 21 U.S.C. section 360bbb-3(b)(1), unless the authorization is terminated or revoked sooner. Performed at [facility], [place] [address], [place], [place] [address]   Blood culture (routine x 2)     Status: Abnormal (Preliminary result)   Collection Time: 10/24/18  7:06 AM   Specimen: BLOOD LEFT FOREARM  Result Value Ref Range Status   Specimen Description BLOOD LEFT FOREARM  Final   Special Requests    Final    BOTTLES DRAWN AEROBIC AND ANAEROBIC Blood Culture adequate volume   Culture  Setup Time   Final    AEROBIC BOTTLE ONLY GRAM POSITIVE COCCI CRITICAL RESULT CALLED TO, READ BACK BY AND VERIFIED WITH: J. LEDFORD,PHARMD 0421 10/25/2018 T. TYSOR    Culture (A)  Final    STAPHYLOCOCCUS AUREUS CULTURE REINCUBATED FOR BETTER GROWTH Performed at [facility], [place] [address], [place], [place] [address]    Report Status PENDING  Incomplete  Blood Culture ID Panel (Reflexed)     Status: Abnormal   Collection Time: 10/24/18  7:06 AM  Result Value Ref Range Status   Enterococcus species NOT DETECTED NOT DETECTED Final   Listeria monocytogenes NOT DETECTED NOT DETECTED Final   Staphylococcus species DETECTED (A) NOT DETECTED Final    Comment: CRITICAL RESULT CALLED TO, READ BACK BY AND VERIFIED WITH: J. LEDFORD,PHARMD 0421 10/25/2018 T. Rosalia Hammers  Staphylococcus aureus (BCID) DETECTED (A) NOT DETECTED Final    Comment: Methicillin (oxacillin)-resistant Staphylococcus aureus (MRSA). MRSA is predictably resistant to beta-lactam antibiotics (except ceftaroline). Preferred therapy is vancomycin unless clinically contraindicated. Patient requires contact precautions if  hospitalized. CRITICAL RESULT CALLED TO, READ BACK BY AND VERIFIED WITH: J. LEDFORD,PHARMD 0421 10/25/2018 T. TYSOR    Methicillin resistance DETECTED (A) NOT DETECTED Final    Comment: CRITICAL RESULT CALLED TO, READ BACK BY AND VERIFIED WITH: J. LEDFORD,PHARMD 0421 10/25/2018 T. TYSOR    Streptococcus species NOT DETECTED NOT DETECTED Final   Streptococcus agalactiae NOT DETECTED NOT DETECTED Final   Streptococcus pneumoniae NOT DETECTED NOT DETECTED Final   Streptococcus pyogenes NOT DETECTED NOT DETECTED Final   Acinetobacter baumannii NOT DETECTED NOT DETECTED Final   Enterobacteriaceae species NOT DETECTED NOT DETECTED Final   Enterobacter cloacae complex NOT DETECTED NOT DETECTED Final   Escherichia coli NOT  DETECTED NOT DETECTED Final   Klebsiella oxytoca NOT DETECTED NOT DETECTED Final   Klebsiella pneumoniae NOT DETECTED NOT DETECTED Final   Proteus species NOT DETECTED NOT DETECTED Final   Serratia marcescens NOT DETECTED NOT DETECTED Final   Haemophilus influenzae NOT DETECTED NOT DETECTED Final   Neisseria meningitidis NOT DETECTED NOT DETECTED Final   Pseudomonas aeruginosa NOT DETECTED NOT DETECTED Final   Candida albicans NOT DETECTED NOT DETECTED Final   Candida glabrata NOT DETECTED NOT DETECTED Final   Candida krusei NOT DETECTED NOT DETECTED Final   Candida parapsilosis NOT DETECTED NOT DETECTED Final   Candida tropicalis NOT DETECTED NOT DETECTED Final    Comment: Performed at [facility], [place]. [address], [place], [place] [address]  Blood culture (routine x 2)     Status: None (Preliminary result)   Collection Time: 10/24/18  7:24 AM   Specimen: BLOOD  Result Value Ref Range Status   Specimen Description BLOOD RIGHT ANTECUBITAL  Final   Special Requests   Final    BOTTLES DRAWN AEROBIC AND ANAEROBIC Blood Culture results may not be optimal due to an inadequate volume of blood received in culture bottles   Culture  Setup Time   Final    GRAM POSITIVE COCCI IN CLUSTERS AEROBIC BOTTLE ONLY CRITICAL VALUE NOTED.  VALUE IS CONSISTENT WITH PREVIOUSLY REPORTED AND CALLED VALUE. Performed at [facility], [place] [address], [place], [place] [address]    Culture GRAM POSITIVE COCCI  Final   Report Status PENDING  Incomplete    Radiology Dg Shoulder Right  Result Date: 10/23/2018 CLINICAL DATA:  Right shoulder pain and limited range of motion. No known injury. EXAM: RIGHT SHOULDER - 2+ VIEW COMPARISON:  None. FINDINGS: There is no evidence of fracture or dislocation. Suggestion of mild inferior glenoid osteophyte. Mild proliferative change at the acromioclavicular joint. Small soft tissue calcification in the region of the rotator cuff insertion. IMPRESSION: 1. No acute  osseous abnormality.  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 2. Small soft tissue calcification in the region of the rotator cuff insertion may represent calcific tendinopathy. Electronically Signed   By: Keith Rake M.D.   On: 10/23/2018 23:00   Ct Angio Chest Pe W And/or Wo Contrast  Result Date: 10/24/2018 CLINICAL DATA:  Shortness of breath EXAM: CT ANGIOGRAPHY CHEST WITH CONTRAST TECHNIQUE: Multidetector CT imaging of the chest was performed using the standard protocol during bolus administration of intravenous contrast. Multiplanar CT image reconstructions and MIPs were obtained to evaluate the vascular anatomy. CONTRAST:  183mL OMNIPAQUE IOHEXOL 350 MG/ML SOLN COMPARISON:  Contemporary CT of the shoulder FINDINGS: Cardiovascular: Suboptimal opacification of the pulmonary arteries central pulmonary arterial contrast bolus measuring only 180 Hounsfield units. Furthermore there is extensive streak artifact from retained contrast in the superior vena cava as well as respiratory motion artifact and cardiac pulsation artifact. Combination of these findings limits detection of segmental and more distal pulmonary emboli. No large central embolus is seen in the pulmonary trunk or main pulmonary arteries. No central pulmonary arterial enlargement. No CT evidence of right heart strain. Atherosclerotic plaque within the normal caliber aorta. Normal heart size. No pericardial effusion. Mediastinum/Nodes: No enlarged mediastinal or axillary lymph nodes. The trachea and esophagus are unremarkable. The thyroid gland and thoracic inlet are free of significant abnormality. Lungs/Pleura: Lung volumes are diminished with bandlike basilar opacities likely reflecting a combination of subsegmental atelectasis and/or scarring. More dependent ground-glass atelectasis is noted posteriorly. No consolidative process. No pneumothorax or effusion. Small amount of subpleural fat in the bases. Upper Abdomen: No acute abnormalities present in  the visualized portions of the upper abdomen. Musculoskeletal: There is some abnormal sclerosis and erosive change at the right sternoclavicular joint with adjacent synovial thickening and surrounding phlegmonous change. Inflammatory features appear to extend to the right first sternocostal joint as well (coronal 11/107). No other acute osseous abnormality is evident. No fracture or traumatic malalignment. Mild glenohumeral and acromioclavicular arthrosis is in the left shoulder. A right glenohumeral arthrosis is present as better detailed on dedicated CT of the shoulder. Insert Degen spine Review of the MIP images confirms the above findings. IMPRESSION: 1. Suboptimal opacification of the pulmonary arteries due to poor contrast bolus, motion artifact and streak from the retained contrast in the superior vena cava. This limits evaluation of the segmental and more distal pulmonary emboli. No large central pulmonary embolus, pulmonary artery enlargement or evidence of right heart strain. 2. Abnormal sclerosis and erosive change at the right sternoclavicular joint with adjacent synovial thickening and surrounding phlegmonous change. Inflammatory features appear to extend to the right first sternocostal joint as well. Findings are concerning for septic arthritis. 3. Aortic Atherosclerosis (ICD10-I70.0). These results were called by telephone at the time of interpretation on 10/24/2018 at 6:50 am to Dr. Pryor Curia , who verbally acknowledged these results. Electronically Signed   By: Lovena Le M.D.   On: 10/24/2018 06:57   Ct Shoulder Right Wo Contrast  Result Date: 10/24/2018 CLINICAL DATA:  Unable to elevate right shoulder. EXAM: CT OF THE UPPER RIGHT EXTREMITY WITH CONTRAST TECHNIQUE: Multidetector CT imaging of the upper right extremity was performed according to the standard protocol following intravenous contrast administration. COMPARISON:  None. CONTRAST:  133mL OMNIPAQUE IOHEXOL 350 MG/ML SOLN FINDINGS:  Bones/Joint/Cartilage No acute fracture or traumatic malalignment. There is mild glenohumeral arthrosis. There is a small sclerotic bone island in the humeral head which appears seated within glenoid. Acromioclavicular alignment is maintained albeit with mild arthrosis. No suspicious osseous lesion. Ligaments Suboptimally assessed by CT. Muscles and Tendons Muscle quality is age-appropriate. There is no abnormal atrophy or fatty infiltration of the muscle bellies. No abnormal retraction. No intramuscular hematoma or other acute muscular or ligamentous injury is identified. Soft tissues No soft tissue swelling. No sizeable joint effusion. Axillary soft tissues unremarkable. For details of the chest wall and included lung, see dedicated CT of the chest. IMPRESSION: 1. No acute osseous abnormality. 2. Mild glenohumeral and acromioclavicular arthrosis. 3. For details of the chest wall and included lung, see dedicated chest CT angiography. These results were called by telephone  at the time of interpretation on 10/24/2018 at 6:30 am to Dr. Pryor Curia , who verbally acknowledged these results. Electronically Signed   By: Lovena Le M.D.   On: 10/24/2018 06:41     Assessment/Plan: S/P Procedure(s) (LRB): I&D Right sternoclavicular joint (Right) With positive blood culture have ordered echo and will proceed today with id of joint and placement of vac.  The goals risks and alternatives of the planned surgical procedure Procedure(s): I&D Right sternoclavicular joint (Right)  have been discussed with the patient in detail. The risks of the procedure including death, infection, stroke, myocardial infarction, bleeding, blood transfusion have all been discussed specifically.  I have quoted Tiffany Bullock a 1% of perioperative mortality and a complication rate as high as 10 %. The patient's questions have been answered.Tiffany Bullock is willing  to proceed with the planned procedure.  Grace Isaac  MD 10/25/2018 9:16 AM

## 2018-10-25 NOTE — Anesthesia Procedure Notes (Signed)
Procedure Name: Intubation Date/Time: 10/25/2018 3:05 PM Performed by: Imagene Riches, CRNA Pre-anesthesia Checklist: Patient identified, Emergency Drugs available, Suction available and Patient being monitored Patient Re-evaluated:Patient Re-evaluated prior to induction Oxygen Delivery Method: Circle System Utilized Preoxygenation: Pre-oxygenation with 100% oxygen Induction Type: IV induction Laryngoscope Size: Glidescope and 3 Grade View: Grade I Tube type: Oral Tube size: 7.0 mm Number of attempts: 1 Airway Equipment and Method: Stylet,  Oral airway and Video-laryngoscopy Placement Confirmation: ETT inserted through vocal cords under direct vision,  positive ETCO2 and breath sounds checked- equal and bilateral Secured at: 22 cm Tube secured with: Tape Dental Injury: Teeth and Oropharynx as per pre-operative assessment

## 2018-10-25 NOTE — Anesthesia Preprocedure Evaluation (Addendum)
Anesthesia Evaluation  Patient identified by MRN, date of birth, ID band Patient awake    Reviewed: Allergy & Precautions, NPO status , Patient's Chart, lab work & pertinent test results  Airway Mallampati: II  TM Distance: >3 FB     Dental   Pulmonary    breath sounds clear to auscultation       Cardiovascular hypertension,  Rhythm:Regular Rate:Normal     Neuro/Psych    GI/Hepatic negative GI ROS, Neg liver ROS,   Endo/Other    Renal/GU Renal diseasenegative Renal ROS     Musculoskeletal   Abdominal   Peds  Hematology  (+) anemia ,   Anesthesia Other Findings   Reproductive/Obstetrics                            Anesthesia Physical Anesthesia Plan  ASA: III  Anesthesia Plan: General   Post-op Pain Management:    Induction: Intravenous  PONV Risk Score and Plan: 3 and Ondansetron, Dexamethasone, Midazolam and Treatment may vary due to age or medical condition  Airway Management Planned: Oral ETT  Additional Equipment:   Intra-op Plan:   Post-operative Plan:   Informed Consent: I have reviewed the patients History and Physical, chart, labs and discussed the procedure including the risks, benefits and alternatives for the proposed anesthesia with the patient or authorized representative who has indicated his/her understanding and acceptance.     Dental advisory given  Plan Discussed with: CRNA and Anesthesiologist  Anesthesia Plan Comments:         Anesthesia Quick Evaluation

## 2018-10-25 NOTE — Transfer of Care (Signed)
Immediate Anesthesia Transfer of Care Note  Patient: Tiffany Bullock  Procedure(s) Performed: I&D Right sternoclavicular joint (Right ) TRANSESOPHAGEAL ECHOCARDIOGRAM (TEE) (N/A )  Patient Location: PACU  Anesthesia Type:General  Level of Consciousness: drowsy  Airway & Oxygen Therapy: Patient Spontanous Breathing and Patient connected to face mask oxygen  Post-op Assessment: Report given to RN and Post -op Vital signs reviewed and stable  Post vital signs: Reviewed and stable  Last Vitals:  Vitals Value Taken Time  BP    Temp    Pulse    Resp    SpO2      Last Pain:  Vitals:   10/25/18 1122  TempSrc:   PainSc: 10-Worst pain ever         Complications: No apparent anesthesia complications

## 2018-10-25 NOTE — Consult Note (Addendum)
CARDIOLOGY CONSULT NOTE  Patient ID: Tiffany Bullock MRN: [id number] DOB/AGE: Feb 04, 1961 58 y.o.  Admit date: 10/23/2018 Referring Physician  Axel Filler Primary Physician:  [facility] Reason for Consultation  Sepsis and endocarditis  HPI:    Tiffany Bullock  is a 58 y.o. female  with With history ofAfrican-American female hypertension, prediabetes mellitus, obstructive sleep apnea on CPAP, admitted to the hospital with severe right shoulder pain and eventually diagnosed with right sternoclavicular joint osteomyelitis and abscess.  Blood cultures were positive for MrSA.  An echocardiogram was obtained which was essentially unremarkable without vegetation.  Patient denies any valvular heart disease history, denies recent dental work, last dental work was about 3-4 months ago. No h/o illicit drug use. No trauma.  Presently still has pain at the right shoulder joint and upper chest area, but otherwise states that she is doing well.  Past Medical History:  Diagnosis Date  . Anemia   . Blood transfusion without reported diagnosis Age 45  . Essential hypertension 10/24/2018  . Obesity (BMI 35.0-39.9 without comorbidity)   . Personal history of colonic polyp - adenoma 11/05/2013  . Sleep apnea    uses cpap    Past Surgical History:  Procedure Laterality Date  . back surgery  Age 30   Fatty tumor removed  . BOWEL RESECTION  2003   Bowel Obstruction Surgery  . COLONOSCOPY N/A 11/05/2013   Procedure: COLONOSCOPY;  Surgeon: [name], MD;  Location: [facility];  Service: Endoscopy;  Laterality: N/A;  . TUBAL LIGATION  15 years ago    Social History   Socioeconomic History  . Marital status: Single    Spouse name: Not on file  . Number of children: Not on file  . Years of education: Not on file  . Highest education level: Not on file  Occupational History  . Not on file  Social Needs  . Financial resource strain: Not on file  . Food  insecurity    Worry: Not on file    Inability: Not on file  . Transportation needs    Medical: Not on file    Non-medical: Not on file  Tobacco Use  . Smoking status: Never Smoker  . Smokeless tobacco: Never Used  Substance and Sexual Activity  . Alcohol use: Yes    Alcohol/week: 1.0 standard drinks    Types: 1 Standard drinks or equivalent per week  . Drug use: No  . Sexual activity: Not Currently    Partners: Male  Lifestyle  . Physical activity    Days per week: Not on file    Minutes per session: Not on file  . Stress: Not on file  Relationships  . Social Herbalist on phone: Not on file    Gets together: Not on file    Attends religious service: Not on file    Active member of club or organization: Not on file    Attends meetings of clubs or organizations: Not on file    Relationship status: Not on file  . Intimate partner violence    Fear of current or ex partner: Not on file    Emotionally abused: Not on file    Physically abused: Not on file    Forced sexual activity: Not on file  Other Topics Concern  . Not on file  Social History Narrative  . Not on file   ROS  Review of Systems  Constitution: Negative for chills, decreased appetite, malaise/fatigue  and weight gain.  Cardiovascular: Negative for dyspnea on exertion, leg swelling and syncope.  Respiratory: Positive for snoring.   Endocrine: Negative for cold intolerance.  Hematologic/Lymphatic: Does not bruise/bleed easily.  Musculoskeletal: Positive for joint pain (right shoulder). Negative for joint swelling.  Gastrointestinal: Negative for abdominal pain, anorexia, change in bowel habit, hematochezia and melena.  Neurological: Negative for headaches and light-headedness.  Psychiatric/Behavioral: Negative for depression and substance abuse.  All other systems reviewed and are negative.   Objective  Blood pressure (!) 141/75, pulse 89, temperature 98.7 F (37.1 C), temperature source Oral,  resp. rate 18, height 5\' 7"  (1.702 m), weight (!) 173 kg, last menstrual period 07/24/2011, SpO2 93 %. Body mass index is 59.73 kg/m.  Physical Exam  Constitutional: She appears well-developed. No distress.  Morbidly obese  HENT:  Head: Atraumatic.  Eyes: Conjunctivae are normal.  Neck: Neck supple. No thyromegaly present.  Short neck and difficult to evaluate JVP  Cardiovascular: Normal rate, regular rhythm and normal heart sounds. Exam reveals no gallop.  No murmur heard. Pulses:      Carotid pulses are 2+ on the right side and 2+ on the left side. Femoral and popliteal pulse difficult to feel due to patient's body habitus.  Large adipose tissue also noted in her legs and ankle and pedal pulses are faint. Capillary fill normal.  Pulmonary/Chest: Effort normal and breath sounds normal.  Abdominal: Soft. Bowel sounds are normal.  Obese. Pannus present  Musculoskeletal: Normal range of motion.        General: No edema.  Neurological: She is alert.  Skin: Skin is warm and dry.  Psychiatric: She has a normal mood and affect.    Radiology   Dg Shoulder Right  Result Date: 10/23/2018 CLINICAL DATA:  Right shoulder pain and limited range of motion. No known injury. EXAM: RIGHT SHOULDER - 2+ VIEW COMPARISON:  None. FINDINGS: There is no evidence of fracture or dislocation. Suggestion of mild inferior glenoid osteophyte. Mild proliferative change at the acromioclavicular joint. Small soft tissue calcification in the region of the rotator cuff insertion. IMPRESSION: 1. No acute osseous abnormality.  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 2. Small soft tissue calcification in the region of the rotator cuff insertion may represent calcific tendinopathy. Electronically Signed   By: Keith Rake M.D.   On: 10/23/2018 23:00   Ct Angio Chest Pe W And/or Wo Contrast  Result Date: 10/24/2018 CLINICAL DATA:  Shortness of breath EXAM: CT ANGIOGRAPHY CHEST WITH CONTRAST TECHNIQUE: Multidetector CT imaging of  the chest was performed using the standard protocol during bolus administration of intravenous contrast. Multiplanar CT image reconstructions and MIPs were obtained to evaluate the vascular anatomy. CONTRAST:  175mL OMNIPAQUE IOHEXOL 350 MG/ML SOLN COMPARISON:  Contemporary CT of the shoulder FINDINGS: Cardiovascular: Suboptimal opacification of the pulmonary arteries central pulmonary arterial contrast bolus measuring only 180 Hounsfield units. Furthermore there is extensive streak artifact from retained contrast in the superior vena cava as well as respiratory motion artifact and cardiac pulsation artifact. Combination of these findings limits detection of segmental and more distal pulmonary emboli. No large central embolus is seen in the pulmonary trunk or main pulmonary arteries. No central pulmonary arterial enlargement. No CT evidence of right heart strain. Atherosclerotic plaque within the normal caliber aorta. Normal heart size. No pericardial effusion. Mediastinum/Nodes: No enlarged mediastinal or axillary lymph nodes. The trachea and esophagus are unremarkable. The thyroid gland and thoracic inlet are free of significant abnormality. Lungs/Pleura: Lung volumes are diminished with bandlike basilar  opacities likely reflecting a combination of subsegmental atelectasis and/or scarring. More dependent ground-glass atelectasis is noted posteriorly. No consolidative process. No pneumothorax or effusion. Small amount of subpleural fat in the bases. Upper Abdomen: No acute abnormalities present in the visualized portions of the upper abdomen. Musculoskeletal: There is some abnormal sclerosis and erosive change at the right sternoclavicular joint with adjacent synovial thickening and surrounding phlegmonous change. Inflammatory features appear to extend to the right first sternocostal joint as well (coronal 11/107). No other acute osseous abnormality is evident. No fracture or traumatic malalignment. Mild  glenohumeral and acromioclavicular arthrosis is in the left shoulder. A right glenohumeral arthrosis is present as better detailed on dedicated CT of the shoulder. Insert Degen spine Review of the MIP images confirms the above findings. IMPRESSION: 1. Suboptimal opacification of the pulmonary arteries due to poor contrast bolus, motion artifact and streak from the retained contrast in the superior vena cava. This limits evaluation of the segmental and more distal pulmonary emboli. No large central pulmonary embolus, pulmonary artery enlargement or evidence of right heart strain. 2. Abnormal sclerosis and erosive change at the right sternoclavicular joint with adjacent synovial thickening and surrounding phlegmonous change. Inflammatory features appear to extend to the right first sternocostal joint as well. Findings are concerning for septic arthritis. 3. Aortic Atherosclerosis (ICD10-I70.0). These results were called by telephone at the time of interpretation on 10/24/2018 at 6:50 am to Dr. Pryor Curia , who verbally acknowledged these results. Electronically Signed   By: Lovena Le M.D.   On: 10/24/2018 06:57   Ct Shoulder Right Wo Contrast  Result Date: 10/24/2018 CLINICAL DATA:  Unable to elevate right shoulder. EXAM: CT OF THE UPPER RIGHT EXTREMITY WITH CONTRAST TECHNIQUE: Multidetector CT imaging of the upper right extremity was performed according to the standard protocol following intravenous contrast administration. COMPARISON:  None. CONTRAST:  171mL OMNIPAQUE IOHEXOL 350 MG/ML SOLN FINDINGS: Bones/Joint/Cartilage No acute fracture or traumatic malalignment. There is mild glenohumeral arthrosis. There is a small sclerotic bone island in the humeral head which appears seated within glenoid. Acromioclavicular alignment is maintained albeit with mild arthrosis. No suspicious osseous lesion. Ligaments Suboptimally assessed by CT. Muscles and Tendons Muscle quality is age-appropriate. There is no abnormal  atrophy or fatty infiltration of the muscle bellies. No abnormal retraction. No intramuscular hematoma or other acute muscular or ligamentous injury is identified. Soft tissues No soft tissue swelling. No sizeable joint effusion. Axillary soft tissues unremarkable. For details of the chest wall and included lung, see dedicated CT of the chest. IMPRESSION: 1. No acute osseous abnormality. 2. Mild glenohumeral and acromioclavicular arthrosis. 3. For details of the chest wall and included lung, see dedicated chest CT angiography. These results were called by telephone at the time of interpretation on 10/24/2018 at 6:30 am to Dr. Pryor Curia , who verbally acknowledged these results. Electronically Signed   By: Lovena Le M.D.   On: 10/24/2018 06:41    Laboratory Examination    CMP Latest Ref Rng & Units 10/25/2018 10/25/2018 10/23/2018  Glucose 70 - 99 mg/dL 122(H) 125(H) 144(H)  BUN 6 - 20 mg/dL 14 13 18   Creatinine 0.44 - 1.00 mg/dL 0.96 1.09(H) 1.14(H)  Sodium 135 - 145 mmol/L 137 136 135  Potassium 3.5 - 5.1 mmol/L 3.6 3.7 3.9  Chloride 98 - 111 mmol/L 99 99 99  CO2 22 - 32 mmol/L 26 25 23   Calcium 8.9 - 10.3 mg/dL 9.1 9.2 9.9  Total Protein 6.5 - 8.1 g/dL 7.4 -  8.0  Total Bilirubin 0.3 - 1.2 mg/dL 1.0 - 0.8  Alkaline Phos 38 - 126 U/L 79 - 90  AST 15 - 41 U/L 24 - 21  ALT 0 - 44 U/L 22 - 17   CBC Latest Ref Rng & Units 10/25/2018 10/25/2018 10/23/2018  WBC 4.0 - 10.5 K/uL 11.8(H) 11.4(H) 11.3(H)  Hemoglobin 12.0 - 15.0 g/dL 11.1(L) 10.7(L) 12.0  Hematocrit 36.0 - 46.0 % 35.2(L) 34.8(L) 38.1  Platelets 150 - 400 K/uL 237 265 293   Lipid Panel     Component Value Date/Time   CHOL 221 (H) 11/09/2012 1049   TRIG 56 11/09/2012 1049   HDL 77 11/09/2012 1049   CHOLHDL 2.9 11/09/2012 1049   VLDL 11 11/09/2012 1049   LDLCALC 133 (H) 11/09/2012 1049   HEMOGLOBIN A1C Lab Results  Component Value Date   HGBA1C 6.3 (H) 10/24/2018   MPG 134.11 10/24/2018   TSH No results for input(s): TSH in  the last 8760 hours. Cardiac Panel (last 3 results)  Medications:  Scheduled Meds: . [MAR Hold] enoxaparin (LOVENOX) injection  85 mg Subcutaneous Q24H  . [MAR Hold] irbesartan  300 mg Oral Daily   And  . [MAR Hold] hydrochlorothiazide  25 mg Oral Daily  . [MAR Hold] insulin aspart  0-5 Units Subcutaneous QHS  . [MAR Hold] insulin aspart  0-9 Units Subcutaneous TID WC  . [MAR Hold] sodium chloride flush  3 mL Intravenous Q12H   Continuous Infusions: . sodium chloride    . [MAR Hold] vancomycin     PRN Meds:.[MAR Hold] acetaminophen **OR** [MAR Hold] acetaminophen, [MAR Hold] HYDROcodone-acetaminophen, [MAR Hold]  HYDROmorphone (DILAUDID) injection Medications Discontinued During This Encounter  Medication Reason  . valsartan-hydrochlorothiazide (DIOVAN-HCT) 320-25 MG per tablet 1 tablet P&T Policy: Therapeutic Substitute  . enoxaparin (LOVENOX) injection 40 mg      Home Medications:   Prior to Admission medications   Medication Sig Start Date End Date Taking? Authorizing Provider  cholecalciferol (VITAMIN D3) 25 MCG (1000 UT) tablet Take 1,000 Units by mouth daily.   Yes [provider]  ferrous sulfate 325 (65 FE) MG tablet Take 325 mg by mouth daily with breakfast.   Yes [provider]  metFORMIN (GLUCOPHAGE) 500 MG tablet Take 1 tablet by mouth daily.   Yes [provider]  valsartan-hydrochlorothiazide (DIOVAN-HCT) 320-25 MG tablet Take 1 tablet by mouth daily.   Yes [provider]     Cardiac studies   1. The left ventricle has normal systolic function with an ejection fraction of 60-65%. The cavity size was normal. Left ventricular diastolic parameters were normal.  2. The right ventricle has normal systolic function. The cavity was normal. There is no increase in right ventricular wall thickness.  3. Mild calcification of the anterior mitral valve leaflet. There is mild mitral annular calcification present.  4. There is a calcified  density on the atrial side of the MV apparatus that likely represents mitral annular calcifications. Clinical correlation recommended.  5. The aorta is normal unless otherwise noted.  Assessment  1.  MRSA bacteremia EKG 10/25/2018: Sinus tachycardia at rate of 100 bpm, poor R wave progression, probably normal variant but cannot exclude anteroseptal infarct old.  Nonspecific T abnormality.  Normal QT interval. 2.  Hypertension 3.  Prediabetes mellitus 4.  Morbid obesity 5.  Mild hyperlipidemia  Plan:  I have discussed with the patient regarding TEE, it is very appropriate procedure.  I have also discussed with her regarding risk of  perforation, minor trauma, hemoptysis and possible aspiration.  Patient is willing to proceed.  Physical examination is otherwise unremarkable, except for morbid obesity and difficult to feel her peripheral pulses, did not see any other significant abnormality.  Thank you for the consultation.  TEE will be performed in the operating room under general anesthesia as she is also going for aspiration of osteomyelitis.  With regard to hypertension, blood pressure was well controlled prior to admission, slightly elevated systolic hypertension probably related to underlying medical issue presently.  We will continue to follow this closely.  She does have mild hyperlipidemia, but making lifestyle change could certainly help to improve her lipid status and I do not think she needs pharmacologic therapy for now.  She is on metformin for prediabetes mellitus.  Adrian Prows, MD, [facility] 10/25/2018, 2:46 PM [facility] [place] Pager: [phone] Office: [phone] If no answer Cell [phone]

## 2018-10-25 NOTE — Progress Notes (Signed)
  Echocardiogram Echocardiogram Transesophageal has been performed.  Tiffany Bullock 10/25/2018, 3:51 PM

## 2018-10-25 NOTE — Consult Note (Signed)
[place] for Infectious Disease       Reason for Consult: MRSA sepsis/RT [place] septic arthritis   Referring Physician: Axel Filler MD  Principal Problem:   Septic arthritis of right [place] joint [facility]) Active Problems:   AKI (acute kidney injury) ([place])   Essential hypertension   . enoxaparin (LOVENOX) injection  85 mg Subcutaneous Q24H  . irbesartan  300 mg Oral Daily   And  . hydrochlorothiazide  25 mg Oral Daily  . insulin aspart  0-5 Units Subcutaneous QHS  . insulin aspart  0-9 Units Subcutaneous TID WC  . sodium chloride flush  3 mL Intravenous Q12H    Recommendations: 1. MRSA sepsis -both of the patient's blood cultures are now positive for MRSA.  Agree with vancomycin dosed for goal trough of 15-20.  The inciting event for her sepsis is unclear at this time.  No skin lesions are noted aside from a small dried wound to her right gluteal cleft that is presently nontender.  It would be unusual for the patient to have MRSA sepsis from an odontogenic source, particularly as she has not had repeat invasive procedures within the last 1 to 2 weeks.  Furthermore, she does not have any oropharyngeal drainage or tenderness to the affected tooth at the present time.  Repeat blood cultures x2 to assess for clearance of her bacteremia.  Check an echocardiogram to evaluate for endocarditis.  Endocarditis and a central source would be mostly concerning given her distant spread to her sternoclavicular joint.  Delay PICC line placement until the patient has had repeat blood cultures negative at 48 hours or longer.  2. RT sternoclavicular septic arthritis -imaging performed on admission with a CT angiogram showed sclerosis with erosive changes to her right sternoclavicular joint.  I appreciate the CT surgery service taking the patient to the operating room today for debridement and wound VAC closure.  The most favored hypothesis for her current infection would be prior injury from blunt  trauma to her chest wall which may have healed but abnormally.  It is common for many of these injuries to be accompanied by hematomas.  Given that she has MRSA sepsis I would favor the development of an occult infection that hematologically spread to an area of prior injury, thus causing her right sternoclavicular septic arthritis.  Due to bony involvement, would anticipate a 6-week course of IV antibiotics to be needed.  3. Leukocytosis -likely reactive from the patient's septic arthritis and MRSA sepsis.  Check CBC with differential daily until within normal limits.  Anticipate a response with empiric antibiotics and debridement to her right [place] joint.  Assessment: Patient is a obese 58 year old African-American female with hypertension admitted for right shoulder pain and subsequently found to have MRSA sepsis and right sternoclavicular septic joint/osteomyelitis.  Antibiotics: Vancomycin, day 2  HPI: Tiffany Bullock is a pleasant  58 y.o. 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  She admits that approximately 5 months ago she fell out of the cab of her truck while attempting to disconnect her pay load.  She denies any syncope with this fall and states she only struck her face, injuring 1 of her front teeth.  She does not recall striking her shoulder or chest to the ground although she did fall face forward.  Within 2 weeks the patient was evaluated by her dentist and had a root canal performed.  Several weeks  later she had a temporary cap placed to the same affected tooth.  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  She denies any odontogenic drainage or significant pain from her tooth over the last several weeks.  Over this past weekend  she began developing loose bowel movements and malaise that ultimately transitioned into fevers and chills.  She also has had worsening right shoulder pain over the last 4 to 5 days as well.  Since her initial fall she denies any further injury either to her shoulder or chest wall.  Reviewing of her work-related responsibilities she frequently has to connect and disconnect multiple cables and the mechanical apparatus of her 18 wheeler truck cab to her pay load with each job and route that she performs.  She denies hearing any pops or suffering any other blunt trauma to her shoulder or chest wall while performing these routine tasks.  She denies any animal exposures or other scratches to her skin aside from a small lesion noted along her right buttock that tends to recur.  She is noticed this lesion periodically but intermittently over the last 1 to 2 years.  Upon imaging here the patient had concern for a right sternoclavicular septic joint/osteomyelitis.  Blood cultures obtained thus far have been positive for MRSA.  Earlier today she was taken to the operating room for debridement of her right sternoclavicular joint were purulent fluid was observed and wound VAC closure was performed.  She has empirically been started on vancomycin. Fever curve, WBC trends, cx results, imaging, and ABX usage all independently reviewed  Review of Systems:  Review of Systems  Constitutional: Positive for chills and fever. Negative for weight loss.  HENT: Negative for congestion, hearing loss, sinus pain and sore throat.   Eyes: Negative for blurred vision, photophobia and discharge.  Respiratory: Negative for cough, hemoptysis and shortness of breath.   Cardiovascular: Negative for chest pain, palpitations, orthopnea and leg swelling.  Gastrointestinal: Negative for abdominal pain, constipation, diarrhea, heartburn, nausea and vomiting.  Genitourinary: Negative for dysuria, flank pain, frequency and urgency.   Musculoskeletal: Positive for joint pain and myalgias. Negative for back pain.       RT collarbone x 5 days  Skin: Negative for itching and rash.  Neurological: Positive for weakness. Negative for tremors, seizures and headaches.  Endo/Heme/Allergies: Negative for polydipsia. Does not bruise/bleed easily.  Psychiatric/Behavioral: Negative for depression and substance abuse. The patient is not nervous/anxious and does not have insomnia.      All other systems reviewed and are negative    Past Medical History:  Diagnosis Date  . Anemia   . Blood transfusion without reported diagnosis Age 72  . Essential hypertension 10/24/2018  . Obesity (BMI 35.0-39.9 without comorbidity)   . Personal history of colonic polyp - adenoma 11/05/2013  . Sleep apnea    uses cpap    Social History   Tobacco Use  . Smoking status: Never Smoker  . Smokeless tobacco: Never Used  Substance Use Topics  . Alcohol use: Yes    Alcohol/week: 1.0 standard drinks    Types: 1 Standard drinks or equivalent per week  . Drug use: No  +2 20 oz. Cans of beer q PM  Family History  Adopted: Yes     Current Facility-Administered  Medications:  .  acetaminophen (TYLENOL) tablet 650 mg, 650 mg, Oral, Q6H PRN **OR** acetaminophen (TYLENOL) suppository 650 mg, 650 mg, Rectal, Q6H PRN, Santos-Sanchez, Idalys, MD .  enoxaparin (LOVENOX) injection 85 mg, 85 mg, Subcutaneous, Q24H, Axel Filler, MD, 85 mg at 10/24/18 2139 .  irbesartan (AVAPRO) tablet 300 mg, 300 mg, Oral, Daily, 300 mg at 10/25/18 1010 **AND** hydrochlorothiazide (HYDRODIURIL) tablet 25 mg, 25 mg, Oral, Daily, Axel Filler, MD, 25 mg at 10/25/18 1010 .  HYDROcodone-acetaminophen (NORCO/VICODIN) 5-325 MG per tablet 1 tablet, 1 tablet, Oral, Q6H PRN, [name], MD, 1 tablet at 10/24/18 1151 .  HYDROmorphone (DILAUDID) injection 0.5 mg, 0.5 mg, Intravenous, Q6H PRN, Santos-Sanchez, Idalys, MD, 0.5 mg at 10/25/18 1122 .  insulin  aspart (novoLOG) injection 0-5 Units, 0-5 Units, Subcutaneous, QHS, Santos-Sanchez, Idalys, MD .  insulin aspart (novoLOG) injection 0-9 Units, 0-9 Units, Subcutaneous, TID WC, Santos-Sanchez, Idalys, MD .  sodium chloride flush (NS) 0.9 % injection 3 mL, 3 mL, Intravenous, Q12H, Santos-Sanchez, Idalys, MD .  vancomycin (VANCOCIN) 2,000 mg in sodium chloride 0.9 % 500 mL IVPB, 2,000 mg, Intravenous, Q24H, Robertson, [place] S, RPH  No Known Allergies  Vitals:   10/25/18 0344 10/25/18 0853  BP: (!) 102/49 (!) 141/75  Pulse: 88 89  Resp: 20 18  Temp: (!) 100.9 F (38.3 C) 98.7 F (37.1 C)  SpO2: 93% 93%     Physical Exam Gen: pleasant, moderate distress secondary to chest wall/[place] joint pain, A&Ox 3 Head: NCAT, no temporal wasting evident EENT: PERRL, EOMI, MMM, adequate dentition Neck: supple, no JVD CV: NRRR, I/VI SEM at LLSB evident Pulm: CTA bilaterally, no wheeze or retractions Abd: soft, NTND, +BS MSK: wound VAC to RT [place] joint wound with TTP but no surrounding erythema Extrems:  trace LE edema, 2+ pulses Skin: no rashes, adequate skin turgor Neuro: CN II-XII grossly intact, no focal neurologic deficits appreciated, gait was not assessed, A&Ox 3   Lab Results  Component Value Date   WBC 11.8 (H) 10/25/2018   HGB 11.1 (L) 10/25/2018   HCT 35.2 (L) 10/25/2018   MCV 83.6 10/25/2018   PLT 237 10/25/2018    Lab Results  Component Value Date   CREATININE 0.96 10/25/2018   BUN 14 10/25/2018   NA 137 10/25/2018   K 3.6 10/25/2018   CL 99 10/25/2018   CO2 26 10/25/2018    Lab Results  Component Value Date   ALT 22 10/25/2018   AST 24 10/25/2018   ALKPHOS 79 10/25/2018     Microbiology: Recent Results (from the past 240 hour(s))  SARS Coronavirus 2 [facility] order, Performed in [facility] hospital lab) Nasopharyngeal Nasopharyngeal Swab     Status: None   Collection Time: 10/24/18  5:35 AM   Specimen: Nasopharyngeal Swab  Result Value Ref Range Status   SARS  Coronavirus 2 NEGATIVE NEGATIVE Final    Comment: (NOTE) If result is NEGATIVE SARS-CoV-2 target nucleic acids are NOT DETECTED. The SARS-CoV-2 RNA is generally detectable in upper and lower  respiratory specimens during the acute phase of infection. The lowest  concentration of SARS-CoV-2 viral copies this assay can detect is 250  copies / mL. A negative result does not preclude SARS-CoV-2 infection  and should not be used as the sole basis for treatment or other  patient management decisions.  A negative result may occur with  improper specimen collection / handling, submission of specimen other  than nasopharyngeal swab, presence of viral mutation(s) within the  areas targeted by this assay, and inadequate number of viral copies  (<250 copies / mL). A negative result must be combined with clinical  observations, patient history, and epidemiological information. If result is POSITIVE SARS-CoV-2 target nucleic acids are DETECTED. The SARS-CoV-2 RNA is generally detectable in upper and lower  respiratory specimens dur ing the acute phase of infection.  Positive  results are indicative of active infection with SARS-CoV-2.  Clinical  correlation with patient history and other diagnostic information is  necessary to determine patient infection status.  Positive results do  not rule out bacterial infection or co-infection with other viruses. If result is PRESUMPTIVE POSTIVE SARS-CoV-2 nucleic acids MAY BE PRESENT.   A presumptive positive result was obtained on the submitted specimen  and confirmed on repeat testing.  While 2019 novel coronavirus  (SARS-CoV-2) nucleic acids may be present in the submitted sample  additional confirmatory testing may be necessary for epidemiological  and / or clinical management purposes  to differentiate between  SARS-CoV-2 and other Sarbecovirus currently known to infect humans.  If clinically indicated additional testing with an alternate test   methodology [phone]) is advised. The SARS-CoV-2 RNA is generally  detectable in upper and lower respiratory sp ecimens during the acute  phase of infection. The expected result is Negative. Fact Sheet for Patients:  StrictlyIdeas.no Fact Sheet for Healthcare Providers: BankingDealers.co.za This test is not yet approved or cleared by the [place] FDA and has been authorized for detection and/or diagnosis of SARS-CoV-2 by FDA under an Emergency Use Authorization (EUA).  This EUA will remain in effect (meaning this test can be used) for the duration of the COVID-19 declaration under Section 564(b)(1) of the Act, 21 U.S.C. section 360bbb-3(b)(1), unless the authorization is terminated or revoked sooner. Performed at [facility], [place] [address], [place], [place] [address]   Blood culture (routine x 2)     Status: Abnormal (Preliminary result)   Collection Time: 10/24/18  7:06 AM   Specimen: BLOOD LEFT FOREARM  Result Value Ref Range Status   Specimen Description BLOOD LEFT FOREARM  Final   Special Requests   Final    BOTTLES DRAWN AEROBIC AND ANAEROBIC Blood Culture adequate volume   Culture  Setup Time   Final    AEROBIC BOTTLE ONLY GRAM POSITIVE COCCI CRITICAL RESULT CALLED TO, READ BACK BY AND VERIFIED WITH: J. LEDFORD,PHARMD 0421 10/25/2018 T. TYSOR    Culture (A)  Final    STAPHYLOCOCCUS AUREUS CULTURE REINCUBATED FOR BETTER GROWTH Performed at [facility], [place] [address], [place], [place] [address]    Report Status PENDING  Incomplete  Blood Culture ID Panel (Reflexed)     Status: Abnormal   Collection Time: 10/24/18  7:06 AM  Result Value Ref Range Status   Enterococcus species NOT DETECTED NOT DETECTED Final   Listeria monocytogenes NOT DETECTED NOT DETECTED Final   Staphylococcus species DETECTED (A) NOT DETECTED Final    Comment: CRITICAL RESULT CALLED TO, READ BACK BY AND VERIFIED WITH: J.  LEDFORD,PHARMD 0421 10/25/2018 T. TYSOR    Staphylococcus aureus (BCID) DETECTED (A) NOT DETECTED Final    Comment: Methicillin (oxacillin)-resistant Staphylococcus aureus (MRSA). MRSA is predictably resistant to beta-lactam antibiotics (except ceftaroline). Preferred therapy is vancomycin unless clinically contraindicated. Patient requires contact precautions if  hospitalized. CRITICAL RESULT CALLED TO, READ BACK BY AND VERIFIED WITH: J. LEDFORD,PHARMD 0421 10/25/2018 T. TYSOR    Methicillin resistance DETECTED (A) NOT DETECTED Final    Comment: CRITICAL  RESULT CALLED TO, READ BACK BY AND VERIFIED WITH: J. LEDFORD,PHARMD 0421 10/25/2018 T. TYSOR    Streptococcus species NOT DETECTED NOT DETECTED Final   Streptococcus agalactiae NOT DETECTED NOT DETECTED Final   Streptococcus pneumoniae NOT DETECTED NOT DETECTED Final   Streptococcus pyogenes NOT DETECTED NOT DETECTED Final   Acinetobacter baumannii NOT DETECTED NOT DETECTED Final   Enterobacteriaceae species NOT DETECTED NOT DETECTED Final   Enterobacter cloacae complex NOT DETECTED NOT DETECTED Final   Escherichia coli NOT DETECTED NOT DETECTED Final   Klebsiella oxytoca NOT DETECTED NOT DETECTED Final   Klebsiella pneumoniae NOT DETECTED NOT DETECTED Final   Proteus species NOT DETECTED NOT DETECTED Final   Serratia marcescens NOT DETECTED NOT DETECTED Final   Haemophilus influenzae NOT DETECTED NOT DETECTED Final   Neisseria meningitidis NOT DETECTED NOT DETECTED Final   Pseudomonas aeruginosa NOT DETECTED NOT DETECTED Final   Candida albicans NOT DETECTED NOT DETECTED Final   Candida glabrata NOT DETECTED NOT DETECTED Final   Candida krusei NOT DETECTED NOT DETECTED Final   Candida parapsilosis NOT DETECTED NOT DETECTED Final   Candida tropicalis NOT DETECTED NOT DETECTED Final    Comment: Performed at [facility], [place] [address], [place], [place] [address]  Blood culture (routine x 2)     Status: None (Preliminary  result)   Collection Time: 10/24/18  7:24 AM   Specimen: BLOOD  Result Value Ref Range Status   Specimen Description BLOOD RIGHT ANTECUBITAL  Final   Special Requests   Final    BOTTLES DRAWN AEROBIC AND ANAEROBIC Blood Culture results may not be optimal due to an inadequate volume of blood received in culture bottles   Culture  Setup Time   Final    GRAM POSITIVE COCCI IN CLUSTERS AEROBIC BOTTLE ONLY CRITICAL VALUE NOTED.  VALUE IS CONSISTENT WITH PREVIOUSLY REPORTED AND CALLED VALUE. Performed at [facility], [place] [address], [place], [place] [address]    Culture [facility] POSITIVE COCCI  Final   Report Status PENDING  Incomplete  Surgical pcr screen     Status: None   Collection Time: 10/25/18  8:39 AM   Specimen: Nasal Mucosa; Nasal Swab  Result Value Ref Range Status   MRSA, PCR NEGATIVE NEGATIVE Final   Staphylococcus aureus NEGATIVE NEGATIVE Final    Comment: (NOTE) The Xpert SA Assay (FDA approved for NASAL specimens in patients 72 years of age and older), is one component of a comprehensive surveillance program. It is not intended to diagnose infection nor to guide or monitor treatment. Performed at [facility], [place] [address], [place], [place] [address]    CTA chest on 10/24/2018  IMPRESSION: 1. Suboptimal opacification of the pulmonary arteries due to poor contrast bolus, motion artifact and streak from the retained contrast in the superior vena cava. This limits evaluation of the segmental and more distal pulmonary emboli. No large central pulmonary embolus, pulmonary artery enlargement or evidence of right heart strain. 2. Abnormal sclerosis and erosive change at the right sternoclavicular joint with adjacent synovial thickening and surrounding phlegmonous change. Inflammatory features appear to extend to the right first sternocostal joint as well. Findings are concerning for septic arthritis. 3. Aortic Atherosclerosis (ICD10-I70.0).  These results  were called by telephone at the time of interpretation on 10/24/2018 at 6:50 am to Dr. Pryor Curia , who verbally acknowledged these results.   Electronically Signed   By: Lovena Le M.D.   On: 10/24/2018 06:57  Evern Core  Hennie Gosa, [place] Infectious Disease [place] [email or web address] 10/25/2018, 12:30 PM

## 2018-10-25 NOTE — Progress Notes (Signed)
PHARMACY - PHYSICIAN COMMUNICATION CRITICAL VALUE ALERT - BLOOD CULTURE IDENTIFICATION (BCID)  Tiffany Bullock is an 58 y.o. female who presented to [facility] on 10/23/2018 with a chief complaint of septic joint   Name of physician (or Provider) Contacted: Dr. Marianna Payment ([facility])  Current antibiotics: Vancomycin   Changes to prescribed antibiotics recommended:  Patient is on recommended antibiotics - No changes needed  Results for orders placed or performed during the hospital encounter of 10/23/18  Blood Culture ID Panel (Reflexed) (Collected: 10/24/2018  7:06 AM)  Result Value Ref Range   Enterococcus species NOT DETECTED NOT DETECTED   Listeria monocytogenes NOT DETECTED NOT DETECTED   Staphylococcus species DETECTED (A) NOT DETECTED   Staphylococcus aureus (BCID) DETECTED (A) NOT DETECTED   Methicillin resistance DETECTED (A) NOT DETECTED   Streptococcus species NOT DETECTED NOT DETECTED   Streptococcus agalactiae NOT DETECTED NOT DETECTED   Streptococcus pneumoniae NOT DETECTED NOT DETECTED   Streptococcus pyogenes NOT DETECTED NOT DETECTED   Acinetobacter baumannii NOT DETECTED NOT DETECTED   Enterobacteriaceae species NOT DETECTED NOT DETECTED   Enterobacter cloacae complex NOT DETECTED NOT DETECTED   Escherichia coli NOT DETECTED NOT DETECTED   Klebsiella oxytoca NOT DETECTED NOT DETECTED   Klebsiella pneumoniae NOT DETECTED NOT DETECTED   Proteus species NOT DETECTED NOT DETECTED   Serratia marcescens NOT DETECTED NOT DETECTED   Haemophilus influenzae NOT DETECTED NOT DETECTED   Neisseria meningitidis NOT DETECTED NOT DETECTED   Pseudomonas aeruginosa NOT DETECTED NOT DETECTED   Candida albicans NOT DETECTED NOT DETECTED   Candida glabrata NOT DETECTED NOT DETECTED   Candida krusei NOT DETECTED NOT DETECTED   Candida parapsilosis NOT DETECTED NOT DETECTED   Candida tropicalis NOT DETECTED NOT DETECTED    [name] 10/25/2018  4:51 AM

## 2018-10-25 NOTE — Anesthesia Postprocedure Evaluation (Signed)
Anesthesia Post Note  Patient: Tiffany Bullock  Procedure(s) Performed: I&D Right sternoclavicular joint (Right ) TRANSESOPHAGEAL ECHOCARDIOGRAM (TEE) (N/A )     Patient location during evaluation: PACU Anesthesia Type: General Level of consciousness: awake Pain management: pain level controlled Vital Signs Assessment: post-procedure vital signs reviewed and stable Respiratory status: spontaneous breathing Cardiovascular status: stable Postop Assessment: no apparent nausea or vomiting Anesthetic complications: no    Last Vitals:  Vitals:   10/25/18 1615 10/25/18 1641  BP: 120/72 (!) 124/56  Pulse: 90 94  Resp: (!) 21   Temp:  37.4 C  SpO2: 100% 96%    Last Pain:  Vitals:   10/25/18 1641  TempSrc: Oral  PainSc:                  Archimedes Harold

## 2018-10-25 NOTE — Progress Notes (Signed)
  Echocardiogram 2D Echocardiogram has been performed.  Tiffany Bullock 10/25/2018, 8:47 AM

## 2018-10-25 NOTE — Brief Op Note (Signed)
      [address]       [place], [address]             [phone]     10/25/2018  3:59 PM  PATIENT:  Tiffany Bullock  58 y.o. female  PRE-OPERATIVE DIAGNOSIS:  Septic arthritis Right [place] joint  POST-OPERATIVE DIAGNOSIS:  Septic arthritis Right [place] joint, small vegetation on mitral valve leaflet   PROCEDURE:  Procedure(s): I&D Right sternoclavicular joint (Right) TRANSESOPHAGEAL ECHOCARDIOGRAM (TEE) (N/A)  SURGEON:  Surgeon(s) and Role: Panel 1:    * Grace Isaac, MD - Primary    * Lajuana Matte, MD - Assisting Panel 2:    * Adrian Prows, MD - Primary    ANESTHESIA:   general  EBL:  minimal   BLOOD ADMINISTERED:none  DRAINS: wound vac in right chest incision   LOCAL MEDICATIONS USED:  NONE  SPECIMEN:  Source of Specimen:  righ sternoclavicular joint   DISPOSITION OF SPECIMEN:  micro   COUNTS:  YES  TOURNIQUET:  * No tourniquets in log *  DICTATION: .Dragon Dictation  PLAN OF CARE: patient is in patient   PATIENT DISPOSITION:  PACU - hemodynamically stable.   Delay start of Pharmacological VTE agent (>24hrs) due to surgical blood loss or risk of bleeding: yes

## 2018-10-26 ENCOUNTER — Encounter (HOSPITAL_COMMUNITY): Payer: Self-pay | Admitting: Cardiothoracic Surgery

## 2018-10-26 LAB — CBC
HCT: 36.3 % (ref 36.0–46.0)
Hemoglobin: 11.2 g/dL — ABNORMAL LOW (ref 12.0–15.0)
MCH: 26 pg (ref 26.0–34.0)
MCHC: 30.9 g/dL (ref 30.0–36.0)
MCV: 84.2 fL (ref 80.0–100.0)
Platelets: 280 10*3/uL (ref 150–400)
RBC: 4.31 MIL/uL (ref 3.87–5.11)
RDW: 14.9 % (ref 11.5–15.5)
WBC: 12.2 10*3/uL — ABNORMAL HIGH (ref 4.0–10.5)
nRBC: 0 % (ref 0.0–0.2)

## 2018-10-26 LAB — BASIC METABOLIC PANEL
Anion gap: 13 (ref 5–15)
BUN: 15 mg/dL (ref 6–20)
CO2: 25 mmol/L (ref 22–32)
Calcium: 9 mg/dL (ref 8.9–10.3)
Chloride: 98 mmol/L (ref 98–111)
Creatinine, Ser: 0.86 mg/dL (ref 0.44–1.00)
GFR calc Af Amer: >60 mL/min (ref >60)
GFR calc non Af Amer: >60 mL/min (ref >60)
Glucose, Bld: 122 mg/dL — ABNORMAL HIGH (ref 70–99)
Potassium: 3.8 mmol/L (ref 3.5–5.1)
Sodium: 136 mmol/L (ref 135–145)

## 2018-10-26 LAB — GLUCOSE, CAPILLARY
Glucose-Capillary: 106 mg/dL — ABNORMAL HIGH (ref 70–99)
Glucose-Capillary: 118 mg/dL — ABNORMAL HIGH (ref 70–99)
Glucose-Capillary: 97 mg/dL (ref 70–99)
Glucose-Capillary: 85 mg/dL (ref 70–99)

## 2018-10-26 MED ORDER — VANCOMYCIN HCL 10 G IV SOLR
2500.0000 mg | INTRAVENOUS | Status: DC
  Administered 2018-10-26 – 2018-10-27 (×2): 2500 mg via INTRAVENOUS
  Filled 2018-10-26 (×3): qty 2500

## 2018-10-26 NOTE — Progress Notes (Signed)
  Subjective:  Patient reports continued pain in her shoulder but states that it is improved. Patient denies fever, chills, shortness of breath, changes in vision. Patient asked about the purpose of the wound drain which we explained. Explained the need for extended course of IV antibiotics at home. Patient articulated understanding of this and is comfortable with the plan of care.    Objective:    Vital Signs (last 24 hours): Vitals:   10/25/18 1615 10/25/18 1641 10/25/18 2044 10/26/18 0342  BP: 120/72 (!) 124/56 135/79 136/80  Pulse: 90 94 97 85  Resp: (!) 21  (!) 24   Temp:  99.3 F (37.4 C) 99.6 F (37.6 C) 98.1 F (36.7 C)  TempSrc:  Oral Oral Oral  SpO2: 100% 96% 94% 95%  Weight:      Height:        Physical Exam: General Alert and answers questions appropriately, no acute distress  Cardiac Regular rate and rhythm, no murmurs, rubs, or gallops  Pulmonary Clear to auscultation bilaterally without wheezes, rhonchi, or rales  Skin Wound vac in place over right upper chest without evidence for for infection. Vac draining small amount of sanguinous fluid.    Assessment/Plan:   Principal Problem:   MRSA bacteremia Active Problems:   Septic arthritis of right [place] joint ([place])   AKI (acute kidney injury) ([place])   Essential hypertension  Ms. Pippin is a 58 year old female with past medical history of hypertension who presents with right clavicular pain and found to have septic arthritis with MRSA bacteremia.  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 Patient remains on IV vancomycin and will require at least 6 weeks IV abx. * Repeat cultures drawn this AM, will need to be negative at least 48 hours before PICC placement  # AKI: Patient with creatinine of 1.14 on admission.  Creatinine down-trended with fluid resuscitation and now WNL.  # HTN: Resumed patient's  valsartan-hydrochlorothiazide 320-25 mg. # Prediabetes: Patient states she takes metformin to prevent diabetes.  Hemoglobin A1c of A1c.  Holding metformin and patient on sliding scale insulin.  Dispo: Anticipated discharge pending clinical improvement.  Jeanmarie Hubert, MD 10/26/2018, 7:31 AM Pager: [phone]

## 2018-10-26 NOTE — Op Note (Signed)
NAME: Tiffany Bullock, Tiffany Bullock MEDICAL RECORD [id number] ACCOUNT [id number] DATE OF BIRTH:09-26-1960 FACILITY: [facility] LOCATION: [facility] PHYSICIAN:Khylah Kendra Maryruth Bun, MD  OPERATIVE REPORT  DATE OF PROCEDURE:  10/25/2018  PREOPERATIVE DIAGNOSIS:  Bacteremia with probable sternoclavicular abscess.  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  PROCEDURE PERFORMED: 1.  Incision, drainage, irrigation, and debridement of right sternoclavicular joint with placement of wound VAC. 2.  TEE done concomitantly by Dr. [name].  Findings dictated under a separate note.  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  CT scan was performed and suggested arthritis in the right sternoclavicular area.  Initially, she had no other signs to suggest endocarditis or other  systemic infection.  We had considered needle aspiration of the joint to characterize the fluid in the joint as infectious versus inflammatory.  When her initial blood cultures drawn on the time of admission returned methicillin-resistant staph, we  recommended proceeding directly with incision and drainage and debridement of the joint.  TEE was also recommended while under general anesthesia.  DESCRIPTION OF PROCEDURE:  The patient was brought to the operating room in stable condition.  She underwent general endotracheal anesthesia by Dr. Nyoka Cowden.  A TEE probe was placed by Dr. [name].  Findings suggested some thickening of the anterior leaflet  of the mitral valve suggestive of endocarditis.  There was no  significant mitral regurgitation or aortic regurgitation.  The right upper chest and neck was prepped with Betadine, draped in a sterile manner.  Appropriate timeout was performed, and we  proceeded with an incision over the right sternoclavicular joint, carried this down to over the joint where we encountered purulent material.  The sternoclavicular joint space was entered.  There was evidence of cartilage destruction.  This was debrided  with a curette.  Cultures were obtained.  Three liters of irrigation with Pulsavac was done to well irrigate the wound.  A black wound VAC sponge was placed into the depths of the wound and the wound VAC applied.  The patient was then awakened in the  operating room and extubated.  The total size of the incision was approximately 3 cm.  Sponge and needle count was reported as correct.  She was then transferred to the recovery room for further postoperative care.  Blood loss was minimal.  LN/NUANCE  D:10/25/2018 T:10/26/2018 JOB:[id number]

## 2018-10-26 NOTE — Progress Notes (Signed)
[facility]   Reason for visit: Follow up on MRSA sepsis/multivalvular IE & RT [place]ine septic arthritis  Antibiotics: Vancomycin, day 3  Interval History: ambulated in hallway using walker. Appetite has been good. Chest wall pain controlled with medication. Fever curve, WBC trends, cx results, op notes, imaging, and ABX usage alli independently reviewed    Current Facility-Administered Medications:  .  acetaminophen (TYLENOL) tablet 650 mg, 650 mg, Oral, Q6H PRN **OR** acetaminophen (TYLENOL) suppository 650 mg, 650 mg, Rectal, Q6H PRN, Lanelle Bal B, MD .  enoxaparin (LOVENOX) injection 85 mg, 85 mg, Subcutaneous, Q24H, Grace Isaac, MD, 85 mg at 10/25/18 2148 .  irbesartan (AVAPRO) tablet 300 mg, 300 mg, Oral, Daily, 300 mg at 10/26/18 0841 **AND** hydrochlorothiazide (HYDRODIURIL) tablet 25 mg, 25 mg, Oral, Daily, Grace Isaac, MD, 25 mg at 10/26/18 0842 .  HYDROcodone-acetaminophen (NORCO/VICODIN) 5-325 MG per tablet 1 tablet, 1 tablet, Oral, Q6H PRN, Grace Isaac, MD, 1 tablet at 10/26/18 1348 .  HYDROmorphone (DILAUDID) injection 0.5 mg, 0.5 mg, Intravenous, Q6H PRN, Grace Isaac, MD, 0.5 mg at 10/25/18 1122 .  insulin aspart (novoLOG) injection 0-5 Units, 0-5 Units, Subcutaneous, QHS, Gerhardt, Edward B, MD .  insulin aspart (novoLOG) injection 0-9 Units, 0-9 Units, Subcutaneous, TID WC, Lanelle Bal B, MD .  sodium chloride flush (NS) 0.9 % injection 3 mL, 3 mL, Intravenous, Q12H, Grace Isaac, MD, 3 mL at 10/26/18 0843 .  vancomycin (VANCOCIN) 2,500 mg in sodium chloride 0.9 % 500 mL IVPB, 2,500 mg, Intravenous, Q24H, Karren Cobble, RPH, Last Rate: 250 mL/hr at 10/26/18 1402, 2,500 mg at 10/26/18 1402   Physical Exam:   Vitals:   10/26/18 0342 10/26/18 0902  BP: 136/80 (!) 155/83  Pulse: 85 82  Resp:  18  Temp: 98.1 F (36.7 C) 98.2 F (36.8 C)  SpO2: 95% 97%   Physical Exam Gen: pleasant, moderate  distress secondary to chest wall/[place] joint pain, A&Ox 3 Head: NCAT, no temporal wasting evident EENT: PERRL, EOMI, MMM, adequate dentition Neck: supple, no JVD CV: NRRR, I/VI SEM at LLSB evident, wound VAC to RT chest wall at sternoclavicular junction Pulm: CTA bilaterally, no wheeze or retractions Abd: soft, NTND, +BS MSK: wound VAC to RT [place] joint wound with TTP but no surrounding erythema Extrems:  trace LE edema, 2+ pulses Skin: no rashes, adequate skin turgor Neuro: CN II-XII grossly intact, no focal neurologic deficits appreciated, gait was not assessed, A&Ox 3   Review of Systems:  Review of Systems  Constitutional: Positive for fever. Negative for chills and weight loss.  HENT: Negative for congestion, hearing loss, sinus pain and sore throat.   Eyes: Negative for blurred vision, photophobia and discharge.  Respiratory: Negative for cough, hemoptysis and shortness of breath.   Cardiovascular: Positive for chest pain. Negative for palpitations, orthopnea and leg swelling.  Gastrointestinal: Negative for abdominal pain, constipation, diarrhea, heartburn, nausea and vomiting.  Genitourinary: Negative for dysuria, flank pain, frequency and urgency.  Musculoskeletal: Negative for back pain, joint pain and myalgias.  Skin: Negative for itching and rash.  Neurological: Negative for tremors, seizures, weakness and headaches.  Endo/Heme/Allergies: Negative for polydipsia. Does not bruise/bleed easily.  Psychiatric/Behavioral: Negative for depression and substance abuse. The patient is not nervous/anxious and does not have insomnia.      Lab Results  Component Value Date   WBC 12.2 (H) 10/26/2018   HGB 11.2 (L) 10/26/2018   HCT 36.3 10/26/2018  MCV 84.2 10/26/2018   PLT 280 10/26/2018    Lab Results  Component Value Date   CREATININE 0.86 10/26/2018   BUN 15 10/26/2018   NA 136 10/26/2018   K 3.8 10/26/2018   CL 98 10/26/2018   CO2 25 10/26/2018    Lab Results  Component  Value Date   ALT 22 10/25/2018   AST 24 10/25/2018   ALKPHOS 79 10/25/2018     Microbiology: Recent Results (from the past 240 hour(s))  SARS Coronavirus 2 [facility] order, Performed in [facility] hospital lab) Nasopharyngeal Nasopharyngeal Swab     Status: None   Collection Time: 10/24/18  5:35 AM   Specimen: Nasopharyngeal Swab  Result Value Ref Range Status   SARS Coronavirus 2 NEGATIVE NEGATIVE Final    Comment: (NOTE) If result is NEGATIVE SARS-CoV-2 target nucleic acids are NOT DETECTED. The SARS-CoV-2 RNA is generally detectable in upper and lower  respiratory specimens during the acute phase of infection. The lowest  concentration of SARS-CoV-2 viral copies this assay can detect is 250  copies / mL. A negative result does not preclude SARS-CoV-2 infection  and should not be used as the sole basis for treatment or other  patient management decisions.  A negative result may occur with  improper specimen collection / handling, submission of specimen other  than nasopharyngeal swab, presence of viral mutation(s) within the  areas targeted by this assay, and inadequate number of viral copies  (<250 copies / mL). A negative result must be combined with clinical  observations, patient history, and epidemiological information. If result is POSITIVE SARS-CoV-2 target nucleic acids are DETECTED. The SARS-CoV-2 RNA is generally detectable in upper and lower  respiratory specimens dur ing the acute phase of infection.  Positive  results are indicative of active infection with SARS-CoV-2.  Clinical  correlation with patient history and other diagnostic information is  necessary to determine patient infection status.  Positive results do  not rule out bacterial infection or co-infection with other viruses. If result is PRESUMPTIVE POSTIVE SARS-CoV-2 nucleic acids MAY BE PRESENT.   A presumptive positive result was obtained on the submitted specimen  and confirmed on repeat testing.   While 2019 novel coronavirus  (SARS-CoV-2) nucleic acids may be present in the submitted sample  additional confirmatory testing may be necessary for epidemiological  and / or clinical management purposes  to differentiate between  SARS-CoV-2 and other Sarbecovirus currently known to infect humans.  If clinically indicated additional testing with an alternate test  methodology [phone]) is advised. The SARS-CoV-2 RNA is generally  detectable in upper and lower respiratory sp ecimens during the acute  phase of infection. The expected result is Negative. Fact Sheet for Patients:  StrictlyIdeas.no Fact Sheet for Healthcare Providers: BankingDealers.co.za This test is not yet approved or cleared by the [place] FDA and has been authorized for detection and/or diagnosis of SARS-CoV-2 by FDA under an Emergency Use Authorization (EUA).  This EUA will remain in effect (meaning this test can be used) for the duration of the COVID-19 declaration under Section 564(b)(1) of the Act, 21 U.S.C. section 360bbb-3(b)(1), unless the authorization is terminated or revoked sooner. Performed at [facility], [place] [address], [place], [place] [address]   Blood culture (routine x 2)     Status: Abnormal (Preliminary result)   Collection Time: 10/24/18  7:06 AM   Specimen: BLOOD LEFT FOREARM  Result Value Ref Range Status   Specimen Description BLOOD LEFT FOREARM  Final  Special Requests   Final    BOTTLES DRAWN AEROBIC AND ANAEROBIC Blood Culture adequate volume   Culture  Setup Time   Final    AEROBIC BOTTLE ONLY GRAM POSITIVE COCCI CRITICAL RESULT CALLED TO, READ BACK BY AND VERIFIED WITH: J. LEDFORD,PHARMD 0421 10/25/2018 T. TYSOR    Culture (A)  Final    STAPHYLOCOCCUS AUREUS SUSCEPTIBILITIES TO FOLLOW Performed at [facility], [place] [address] [place], [place] [address]    Report Status PENDING  Incomplete  Blood Culture ID Panel  (Reflexed)     Status: Abnormal   Collection Time: 10/24/18  7:06 AM  Result Value Ref Range Status   Enterococcus species NOT DETECTED NOT DETECTED Final   Listeria monocytogenes NOT DETECTED NOT DETECTED Final   Staphylococcus species DETECTED (A) NOT DETECTED Final    Comment: CRITICAL RESULT CALLED TO, READ BACK BY AND VERIFIED WITH: J. LEDFORD,PHARMD 0421 10/25/2018 T. TYSOR    Staphylococcus aureus (BCID) DETECTED (A) NOT DETECTED Final    Comment: Methicillin (oxacillin)-resistant Staphylococcus aureus (MRSA). MRSA is predictably resistant to beta-lactam antibiotics (except ceftaroline). Preferred therapy is vancomycin unless clinically contraindicated. Patient requires contact precautions if  hospitalized. CRITICAL RESULT CALLED TO, READ BACK BY AND VERIFIED WITH: J. LEDFORD,PHARMD 0421 10/25/2018 T. TYSOR    Methicillin resistance DETECTED (A) NOT DETECTED Final    Comment: CRITICAL RESULT CALLED TO, READ BACK BY AND VERIFIED WITH: J. LEDFORD,PHARMD 0421 10/25/2018 T. TYSOR    Streptococcus species NOT DETECTED NOT DETECTED Final   Streptococcus agalactiae NOT DETECTED NOT DETECTED Final   Streptococcus pneumoniae NOT DETECTED NOT DETECTED Final   Streptococcus pyogenes NOT DETECTED NOT DETECTED Final   Acinetobacter baumannii NOT DETECTED NOT DETECTED Final   Enterobacteriaceae species NOT DETECTED NOT DETECTED Final   Enterobacter cloacae complex NOT DETECTED NOT DETECTED Final   Escherichia coli NOT DETECTED NOT DETECTED Final   Klebsiella oxytoca NOT DETECTED NOT DETECTED Final   Klebsiella pneumoniae NOT DETECTED NOT DETECTED Final   Proteus species NOT DETECTED NOT DETECTED Final   Serratia marcescens NOT DETECTED NOT DETECTED Final   Haemophilus influenzae NOT DETECTED NOT DETECTED Final   Neisseria meningitidis NOT DETECTED NOT DETECTED Final   Pseudomonas aeruginosa NOT DETECTED NOT DETECTED Final   Candida albicans NOT DETECTED NOT DETECTED Final   Candida  glabrata NOT DETECTED NOT DETECTED Final   Candida krusei NOT DETECTED NOT DETECTED Final   Candida parapsilosis NOT DETECTED NOT DETECTED Final   Candida tropicalis NOT DETECTED NOT DETECTED Final    Comment: Performed at [facility], [place]. [address], [place], [place] [address]  Blood culture (routine x 2)     Status: Abnormal (Preliminary result)   Collection Time: 10/24/18  7:24 AM   Specimen: BLOOD  Result Value Ref Range Status   Specimen Description BLOOD RIGHT ANTECUBITAL  Final   Special Requests   Final    BOTTLES DRAWN AEROBIC AND ANAEROBIC Blood Culture results may not be optimal due to an inadequate volume of blood received in culture bottles   Culture  Setup Time   Final    GRAM POSITIVE COCCI IN CLUSTERS AEROBIC BOTTLE ONLY CRITICAL VALUE NOTED.  VALUE IS CONSISTENT WITH PREVIOUSLY REPORTED AND CALLED VALUE. Performed at [facility], [place] [address] [place], [place] [address]    Culture STAPHYLOCOCCUS AUREUS (A)  Final   Report Status PENDING  Incomplete  Surgical pcr screen     Status: None   Collection Time:  10/25/18  8:39 AM   Specimen: Nasal Mucosa; Nasal Swab  Result Value Ref Range Status   MRSA, PCR NEGATIVE NEGATIVE Final   Staphylococcus aureus NEGATIVE NEGATIVE Final    Comment: (NOTE) The Xpert SA Assay (FDA approved for NASAL specimens in patients 74 years of age and older), is one component of a comprehensive surveillance program. It is not intended to diagnose infection nor to guide or monitor treatment. Performed at [facility], [place] [address], [place], [place] [address]   Aerobic/Anaerobic Culture (surgical/deep wound)     Status: None (Preliminary result)   Collection Time: 10/25/18  3:22 PM   Specimen: Joint, Other; Body Fluid  Result Value Ref Range Status   Specimen Description JOINT FLUID  Final   Special Requests   Final    RT STERNOCLAVICULAR JOINT SAMPLE A PT ON VANCOMYCIN   Gram Stain   Final    FEW WBC PRESENT,BOTH  PMN AND MONONUCLEAR NO ORGANISMS SEEN Performed at [facility], [address], [place], [place] [address]    Culture RARE STAPHYLOCOCCUS AUREUS  Final   Report Status PENDING  Incomplete    Impression/Plan: Patient is a obese 58 year old African-American female with hypertension admitted for right shoulder pain and subsequently found to have MRSA sepsis/mitral valve endocarditis and right sternoclavicular septic joint/osteomyelitis.  1. MRSA sepsis/mitral valve endocarditis -both of the patient's blood cultures are now positive for MRSA.  Agree with vancomycin dosed for goal trough of 15-20.  The inciting event for her sepsis is unclear at this time.  No skin lesions are noted aside from a small dried wound to her right gluteal cleft that is presently nontender.  It would be unusual for the patient to have MRSA sepsis from an odontogenic source, particularly as she has not had repeat invasive procedures within the last 1 to 2 weeks.  Furthermore, she does not have any oropharyngeal drainage or tenderness to the affected tooth at the present time.  F/u repeat blood cultures to assess for clearance of her bacteremia (unrevealing thus far). 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 Her endocarditis/central source would explain distant hematogenous spread to her sternoclavicular joint.  Delay PICC line placement until the patient has had repeat blood cultures negative at 48 hours or longer.  2. RT sternoclavicular septic arthritis -imaging performed on admission with a CT angiogram showed sclerosis with erosive changes to her right sternoclavicular joint.  I appreciate the CT surgery service taking the patient to the operating room promptly for debridement and wound VAC closure.  The most favored hypothesis for her current infection would be prior injury from blunt trauma to her chest wall which may have healed but abnormally.  It is common for many of these injuries to be accompanied by  hematomas.  Given that she has MRSA sepsis I would favor the development of an occult infection that hematologically spread to an area of prior injury, thus causing her right sternoclavicular septic arthritis.  Due to bony involvement and now confirmed MVIE, would anticipate a 6-week course of IV antibiotics to be needed.  3. Leukocytosis -likely reactive from the patient's septic arthritis and MRSA sepsis.  Check CBC with differential daily until within normal limits.  Anticipate a response with empiric antibiotics and debridement to her right [place] joint.

## 2018-10-26 NOTE — Progress Notes (Addendum)
      [address]       [place] [address]             [phone]      1 Day Post-Op Procedure(s) (LRB): I&D Right sternoclavicular joint (Right) TRANSESOPHAGEAL ECHOCARDIOGRAM (TEE) (N/A)  Subjective: Awake and alert, feel better. Pain controlled.   Objective: Vital signs in last 24 hours: Temp:  [97.6 F (36.4 C)-99.6 F (37.6 C)] 98.2 F (36.8 C) (09/03 0902) Pulse Rate:  [82-97] 82 (09/03 0902) Cardiac Rhythm: Normal sinus rhythm;Heart block (09/03 0700) Resp:  [18-24] 18 (09/03 0902) BP: (113-155)/(56-83) 155/83 (09/03 0902) SpO2:  [94 %-100 %] 97 % (09/03 0902) Weight:  [173 kg] 173 kg (09/02 1434)    Intake/Output from previous day: 09/02 0701 - 09/03 0700 In: 500 [IV Piggyback:500] Out: 30 [Drains:30] Intake/Output this shift: No intake/output data recorded.  General appearance: alert, cooperative and mild distress. HEENT- teeth appear to be in good repair.  Neurologic: No obvious deficits. Wound: wound vac in place and functioning appropriately at the time of this exam. RN reported multiple "occlusion alarms"  prior to our arrival. There was minimal serous drainage in the collection chamber.   Lab Results: Recent Labs    10/25/18 0752 10/26/18 0517  WBC 11.8* 12.2*  HGB 11.1* 11.2*  HCT 35.2* 36.3  PLT 237 280   BMET:  Recent Labs    10/25/18 0752 10/26/18 0517  NA 137 136  K 3.6 3.8  CL 99 98  CO2 26 25  GLUCOSE 122* 122*  BUN 14 15  CREATININE 0.96 0.86  CALCIUM 9.1 9.0    PT/INR:  Recent Labs    10/25/18 0752  LABPROT 15.4*  INR 1.2   ABG    Component Value Date/Time   PHART 7.465 (H) 10/25/2018 0935   HCO3 28.1 (H) 10/25/2018 0935   O2SAT 89.7 10/25/2018 0935   CBG (last 3)  Recent Labs    10/25/18 1722 10/25/18 2132 10/26/18 0656  GLUCAP 97 131* 106*    Assessment/Plan: S/P Procedure(s) (LRB): I&D Right sternoclavicular joint (Right) TRANSESOPHAGEAL ECHOCARDIOGRAM (TEE) (N/A)  -POD-1  incision and drainage of right sternoclavicular joint abscess. Operative cultures are pending. On vancomycin for blood cx positive for MRSA.  She was noted on intra-operative TEE to have mitral valve leaflet thickening with a small lesion suspicious for an early vegetation. Would favor IV antibiotics for 6 weeks and also follow up echo in 3-4 weeks unless symptoms dictate otherwise.  Recommend PICC line for long-term ABX, will defer to primary team. Plan vac dressing change tomorrow at the bedside.     LOS: 2 days    Antony Odea, PA-C [phone] 10/26/2018 I have seen and examined Tiffany Bullock and agree with the above assessment  and plan.  Grace Isaac MD Beeper [phone] Office [phone] 10/26/2018 3:17 PM

## 2018-10-26 NOTE — Progress Notes (Signed)
Pharmacy Antibiotic Note  Tiffany Bullock is a 58 y.o. female admitted on 10/23/2018 with Septic arthritis of the [place] joint with MRSA bacteremia + endocarditis.Marland Kitchen  Pharmacy has been consulted for Vancomycin dosing.  ID: Septic arthritis of the [place] joint with MRSA bacteremia + endocarditis. Elevated CRP/sedimentation rate   - Tmax 99.6. WBC 12.2 up today - Scr 0.86 down - 9/2 I&d of joint and placement of vac + TEE with small vegetation on mitral valve leaflet, A small vegetation on the lateral TV leaflet cannot be excluded  Vanco 9/1>>  Intially: Vancomycin 2000 mg IV Q 24 hrs. Goal AUC 400-550. Expected AUC: 473.1 SCr used: 1.14 - 9/3: recalculated Vancomycin 2500mg  IV q 24h (AUC 456 with Scr 0.86)  9/1 Bcx: SA 9/1 BCID: MRSA   Plan: Increase Vanco to 2500mg  IV q 24h on 9/3.  Levels after 3-5 doses at steady state.    Height: 5\' 7"  (170.2 cm) Weight: (!) 381 lb 6.3 oz (173 kg) IBW/kg (Calculated) : 61.6  Temp (24hrs), Avg:98.6 F (37 C), Min:97.6 F (36.4 C), Max:99.6 F (37.6 C)  Recent Labs  Lab 10/23/18 2212 10/25/18 0353 10/25/18 0752 10/26/18 0517  WBC 11.3* 11.4* 11.8* 12.2*  CREATININE 1.14* 1.09* 0.96 0.86  LATICACIDVEN 1.3  --   --   --     Estimated Creatinine Clearance: 122.5 mL/min (by C-G formula based on SCr of 0.86 mg/dL).    No Known Allergies  Tiffany Bullock Tiffany Bullock, PharmD, BCPS Clinical Staff Pharmacist Tiffany Bullock 10/26/2018 9:12 AM

## 2018-10-26 NOTE — Plan of Care (Signed)

## 2018-10-27 ENCOUNTER — Inpatient Hospital Stay: Payer: Self-pay

## 2018-10-27 DIAGNOSIS — I330 Acute and subacute infective endocarditis: Secondary | ICD-10-CM

## 2018-10-27 DIAGNOSIS — K5900 Constipation, unspecified: Secondary | ICD-10-CM

## 2018-10-27 LAB — GLUCOSE, CAPILLARY
Glucose-Capillary: 99 mg/dL (ref 70–99)
Glucose-Capillary: 106 mg/dL — ABNORMAL HIGH (ref 70–99)
Glucose-Capillary: 94 mg/dL (ref 70–99)
Glucose-Capillary: 104 mg/dL — ABNORMAL HIGH (ref 70–99)

## 2018-10-27 LAB — CULTURE, BLOOD (ROUTINE X 2): Special Requests: ADEQUATE

## 2018-10-27 LAB — CBC
HCT: 37.9 % (ref 36.0–46.0)
Hemoglobin: 11.4 g/dL — ABNORMAL LOW (ref 12.0–15.0)
MCH: 25.9 pg — ABNORMAL LOW (ref 26.0–34.0)
MCHC: 30.1 g/dL (ref 30.0–36.0)
MCV: 86.1 fL (ref 80.0–100.0)
Platelets: 276 10*3/uL (ref 150–400)
RBC: 4.4 MIL/uL (ref 3.87–5.11)
RDW: 15 % (ref 11.5–15.5)
WBC: 9.4 10*3/uL (ref 4.0–10.5)
nRBC: 0 % (ref 0.0–0.2)

## 2018-10-27 LAB — VANCOMYCIN, PEAK
Vancomycin Pk: 50 ug/mL — ABNORMAL HIGH (ref 30–40)
Vancomycin Pk: 38 ug/mL (ref 30–40)

## 2018-10-27 LAB — BASIC METABOLIC PANEL
Anion gap: 12 (ref 5–15)
BUN: 20 mg/dL (ref 6–20)
CO2: 25 mmol/L (ref 22–32)
Calcium: 9.2 mg/dL (ref 8.9–10.3)
Chloride: 101 mmol/L (ref 98–111)
Creatinine, Ser: 1.06 mg/dL — ABNORMAL HIGH (ref 0.44–1.00)
GFR calc Af Amer: >60 mL/min (ref >60)
GFR calc non Af Amer: 59 mL/min — ABNORMAL LOW (ref >60)
Glucose, Bld: 97 mg/dL (ref 70–99)
Potassium: 4.1 mmol/L (ref 3.5–5.1)
Sodium: 138 mmol/L (ref 135–145)

## 2018-10-27 MED ORDER — POLYETHYLENE GLYCOL 3350 17 G PO PACK
17.0000 g | PACK | Freq: Once | ORAL | Status: AC
  Administered 2018-10-27: 18:00:00 17 g via ORAL
  Filled 2018-10-27: qty 1

## 2018-10-27 MED ORDER — ENOXAPARIN SODIUM 40 MG/0.4ML ~~LOC~~ SOLN: 40.0000 mg | SUBCUTANEOUS | Status: DC

## 2018-10-27 NOTE — Progress Notes (Signed)
  Subjective:   Patient seen sitting in chair next to bed.  Patient reports she has been getting up more.  Patient denies fever, chills, nausea, vomiting, chest pain, changes in vision.  Patient reports that she is comfortable plan of care.  Reports some mild pain in her left knee which she thinks is secondary to lying in bed for a while.  Objective:    Vital Signs (last 24 hours): Vitals:   10/26/18 0902 10/26/18 2046 10/27/18 0500 10/27/18 0943  BP: (!) 155/83 131/75 132/78 (!) 143/80  Pulse: 82 86 79 (!) 103  Resp: 18 14 16 18   Temp: 98.2 F (36.8 C) 98.1 F (36.7 C) 98.3 F (36.8 C) 98.2 F (36.8 C)  TempSrc: Oral Oral Oral Oral  SpO2: 97% (!) 89% 96% 96%  Weight:      Height:        Physical Exam: General Alert and answers questions appropriately, no acute distress  Cardiac Regular rate and rhythm, no murmurs, rubs, or gallops  Pulmonary Clear to auscultation bilaterally without wheezes, rhonchi, or rales  Skin Wound vac in place without signs of infection  Extremities No peripheral edema      Assessment/Plan:   Principal Problem:   MRSA bacteremia Active Problems:   Septic arthritis of right [place] joint (H[facility])   AKI (acute kidney injury) ([facility])   Essential hypertension   Patient is a 58 year old female with past medical history of hypertension who presented with right clavicular pain found to have septic arthritis and MRSA bacteremia.    # MRSA bacteremia complicated by septic arthritis and native valve endocarditis: Patient with unclear source.  Status post wound VAC placement on 9/2.  Patient last febrile on 9/1@0344 .  2/2 sets of blood cultures from 9/1 growing MRSA. Surgical wound culture with rare MRSA.  * Repeat cultures from 9/3 pending, will place PICC line once cultures are negative for at least 48 hours  # AKI: Creatinine of 1.14 on admission, down trended with fluids.  Creatinine 1.06 on 9/4.  We will continue to follow, especially considering  anti-infective therapy with vancomycin.  # HTN: On home valsartan-hydrochlorothiazide 320-25 mg. # Prediabetes: On metformin as outpatient, now on sliding scale insulin as inpatient.  Dispo: Anticipated discharge in approximately 3-4 days.   Jeanmarie Hubert, MD 10/27/2018, 12:24 PM Pager: [phone]

## 2018-10-27 NOTE — Progress Notes (Signed)
Tiffany Bullock for Infectious Disease   Reason for visit: Follow up on MRSA sepsis/mitral valve IE & RT [place] septic arthritis  Antibiotics: Vancomycin, day 4   Interval History: Wound VAC changed at bedside today by CT surgery service who plan to secure a home wound VAC for her. Pt c/o constipation as she has not had a BM since her admission thus far (typical for her is once daily). Pharmacy planning to check vanc levels this evening. Appetite has been good today. Fever curve, WBC trends, ABX usage, op & echo notes, cx results, and imaging all independently reviewed    Current Facility-Administered Medications:    acetaminophen (TYLENOL) tablet 650 mg, 650 mg, Oral, Q6H PRN **OR** acetaminophen (TYLENOL) suppository 650 mg, 650 mg, Rectal, Q6H PRN, Grace Isaac, MD   enoxaparin (LOVENOX) injection 85 mg, 85 mg, Subcutaneous, Q24H, Grace Isaac, MD, 85 mg at 10/26/18 2140   irbesartan (AVAPRO) tablet 300 mg, 300 mg, Oral, Daily, 300 mg at 10/27/18 1037 **AND** hydrochlorothiazide (HYDRODIURIL) tablet 25 mg, 25 mg, Oral, Daily, Grace Isaac, MD, 25 mg at 10/27/18 1037   HYDROcodone-acetaminophen (NORCO/VICODIN) 5-325 MG per tablet 1 tablet, 1 tablet, Oral, Q6H PRN, Grace Isaac, MD, 1 tablet at 10/27/18 0911   HYDROmorphone (DILAUDID) injection 0.5 mg, 0.5 mg, Intravenous, Q6H PRN, Grace Isaac, MD, 0.5 mg at 10/27/18 0911   insulin aspart (novoLOG) injection 0-5 Units, 0-5 Units, Subcutaneous, QHS, Grace Isaac, MD   insulin aspart (novoLOG) injection 0-9 Units, 0-9 Units, Subcutaneous, TID WC, Grace Isaac, MD   sodium chloride flush (NS) 0.9 % injection 3 mL, 3 mL, Intravenous, Q12H, Grace Isaac, MD, 3 mL at 10/27/18 1000   vancomycin (VANCOCIN) 2,500 mg in sodium chloride 0.9 % 500 mL IVPB, 2,500 mg, Intravenous, Q24H, Karren Cobble, RPH, Last Rate: 250 mL/hr at 10/27/18 1427, 2,500 mg at 10/27/18 1427   Physical Exam:     Vitals:   10/27/18 1350 10/27/18 1651  BP: 108/67 130/76  Pulse: 94 86  Resp: 18 18  Temp: 98.3 F (36.8 C) 98.2 F (36.8 C)  SpO2: 100% 98%   Physical Exam Gen: pleasant,mild distress secondary to chest wall/[place] joint pain, A&Ox 3 Head: NCAT, no temporal wasting evident EENT: PERRL, EOMI, MMM, adequate dentition Neck: supple, no JVD CV: NRRR,I/VI SEM at LLSBevident, wound VAC to RT chest wall at sternoclavicular junction Pulm: CTA bilaterally, no wheeze or retractions Abd: soft, NTND, +BS MSK: wound VAC to RT [place] joint wound with TTP but no surrounding erythema Extrems:traceLE edema, 2+ pulses Skin: no rashes, adequate skin turgor Neuro: CN II-XII grossly intact, no focal neurologic deficits appreciated, gait was not assessed, A&Ox 3   Review of Systems:  Review of Systems  Constitutional: Positive for fever. Negative for chills and weight loss.  HENT: Negative for congestion, hearing loss, sinus pain and sore throat.   Eyes: Negative for blurred vision, photophobia and discharge.  Respiratory: Negative for cough, hemoptysis and shortness of breath.   Cardiovascular: Positive for chest pain. Negative for palpitations, orthopnea and leg swelling.  Gastrointestinal: Negative for abdominal pain, constipation, diarrhea, heartburn, nausea and vomiting.  Genitourinary: Negative for dysuria, flank pain, frequency and urgency.  Musculoskeletal: Positive for joint pain. Negative for back pain and myalgias.       RT shoulder  Skin: Negative for itching and rash.  Neurological: Positive for weakness. Negative for tremors, seizures and headaches.  Endo/Heme/Allergies: Negative for polydipsia. Does not bruise/bleed  easily.  Psychiatric/Behavioral: Negative for depression and substance abuse. The patient is not nervous/anxious and does not have insomnia.      Lab Results  Component Value Date   WBC 9.4 10/27/2018   HGB 11.4 (L) 10/27/2018   HCT 37.9 10/27/2018   MCV 86.1  10/27/2018   PLT 276 10/27/2018    Lab Results  Component Value Date   CREATININE 1.06 (H) 10/27/2018   BUN 20 10/27/2018   NA 138 10/27/2018   K 4.1 10/27/2018   CL 101 10/27/2018   CO2 25 10/27/2018    Lab Results  Component Value Date   ALT 22 10/25/2018   AST 24 10/25/2018   ALKPHOS 79 10/25/2018     Microbiology: Recent Results (from the past 240 hour(s))  SARS Coronavirus 2 [facility] order, Performed in [facility] hospital lab) Nasopharyngeal Nasopharyngeal Swab     Status: None   Collection Time: 10/24/18  5:35 AM   Specimen: Nasopharyngeal Swab  Result Value Ref Range Status   SARS Coronavirus 2 NEGATIVE NEGATIVE Final    Comment: (NOTE) If result is NEGATIVE SARS-CoV-2 target nucleic acids are NOT DETECTED. The SARS-CoV-2 RNA is generally detectable in upper and lower  respiratory specimens during the acute phase of infection. The lowest  concentration of SARS-CoV-2 viral copies this assay can detect is 250  copies / mL. A negative result does not preclude SARS-CoV-2 infection  and should not be used as the sole basis for treatment or other  patient management decisions.  A negative result may occur with  improper specimen collection / handling, submission of specimen other  than nasopharyngeal swab, presence of viral mutation(s) within the  areas targeted by this assay, and inadequate number of viral copies  (<250 copies / mL). A negative result must be combined with clinical  observations, patient history, and epidemiological information. If result is POSITIVE SARS-CoV-2 target nucleic acids are DETECTED. The SARS-CoV-2 RNA is generally detectable in upper and lower  respiratory specimens dur ing the acute phase of infection.  Positive  results are indicative of active infection with SARS-CoV-2.  Clinical  correlation with patient history and other diagnostic information is  necessary to determine patient infection status.  Positive results do  not rule out  bacterial infection or co-infection with other viruses. If result is PRESUMPTIVE POSTIVE SARS-CoV-2 nucleic acids MAY BE PRESENT.   A presumptive positive result was obtained on the submitted specimen  and confirmed on repeat testing.  While 2019 novel coronavirus  (SARS-CoV-2) nucleic acids may be present in the submitted sample  additional confirmatory testing may be necessary for epidemiological  and / or clinical management purposes  to differentiate between  SARS-CoV-2 and other Sarbecovirus currently known to infect humans.  If clinically indicated additional testing with an alternate test  methodology [phone]) is advised. The SARS-CoV-2 RNA is generally  detectable in upper and lower respiratory sp ecimens during the acute  phase of infection. The expected result is Negative. Fact Sheet for Patients:  StrictlyIdeas.no Fact Sheet for Healthcare Providers: BankingDealers.co.za This test is not yet approved or cleared by the [place] FDA and has been authorized for detection and/or diagnosis of SARS-CoV-2 by FDA under an Emergency Use Authorization (EUA).  This EUA will remain in effect (meaning this test can be used) for the duration of the COVID-19 declaration under Section 564(b)(1) of the Act, 21 U.S.C. section 360bbb-3(b)(1), unless the authorization is terminated or revoked sooner. Performed at [facility], [place]  [address] [place], [place] [address]   Blood culture (routine x 2)     Status: Abnormal   Collection Time: 10/24/18  7:06 AM   Specimen: BLOOD LEFT FOREARM  Result Value Ref Range Status   Specimen Description BLOOD LEFT FOREARM  Final   Special Requests   Final    BOTTLES DRAWN AEROBIC AND ANAEROBIC Blood Culture adequate volume   Culture  Setup Time   Final    AEROBIC BOTTLE ONLY GRAM POSITIVE COCCI CRITICAL RESULT CALLED TO, READ BACK BY AND VERIFIED WITH: J. LEDFORD,PHARMD 0421 10/25/2018 Mena Goes Performed at [facility], [place] [address], [place], [place] [address]    Culture METHICILLIN RESISTANT STAPHYLOCOCCUS AUREUS (A)  Final   Report Status 10/27/2018 FINAL  Final   Organism ID, Bacteria METHICILLIN RESISTANT STAPHYLOCOCCUS AUREUS  Final      Susceptibility   Methicillin resistant staphylococcus aureus - MIC*    CIPROFLOXACIN <=0.5 SENSITIVE Sensitive     ERYTHROMYCIN <=0.25 SENSITIVE Sensitive     GENTAMICIN <=0.5 SENSITIVE Sensitive     OXACILLIN >=4 RESISTANT Resistant     TETRACYCLINE <=1 SENSITIVE Sensitive     VANCOMYCIN <=0.5 SENSITIVE Sensitive     TRIMETH/SULFA <=10 SENSITIVE Sensitive     CLINDAMYCIN <=0.25 SENSITIVE Sensitive     RIFAMPIN <=0.5 SENSITIVE Sensitive     Inducible Clindamycin NEGATIVE Sensitive     * METHICILLIN RESISTANT STAPHYLOCOCCUS AUREUS  Blood Culture ID Panel (Reflexed)     Status: Abnormal   Collection Time: 10/24/18  7:06 AM  Result Value Ref Range Status   Enterococcus species NOT DETECTED NOT DETECTED Final   Listeria monocytogenes NOT DETECTED NOT DETECTED Final   Staphylococcus species DETECTED (A) NOT DETECTED Final    Comment: CRITICAL RESULT CALLED TO, READ BACK BY AND VERIFIED WITH: J. LEDFORD,PHARMD 0421 10/25/2018 T. TYSOR    Staphylococcus aureus (BCID) DETECTED (A) NOT DETECTED Final    Comment: Methicillin (oxacillin)-resistant Staphylococcus aureus (MRSA). MRSA is predictably resistant to beta-lactam antibiotics (except ceftaroline). Preferred therapy is vancomycin unless clinically contraindicated. Patient requires contact precautions if  hospitalized. CRITICAL RESULT CALLED TO, READ BACK BY AND VERIFIED WITH: J. LEDFORD,PHARMD 0421 10/25/2018 T. TYSOR    Methicillin resistance DETECTED (A) NOT DETECTED Final    Comment: CRITICAL RESULT CALLED TO, READ BACK BY AND VERIFIED WITH: J. LEDFORD,PHARMD 0421 10/25/2018 T. TYSOR    Streptococcus species NOT DETECTED NOT DETECTED Final   Streptococcus agalactiae  NOT DETECTED NOT DETECTED Final   Streptococcus pneumoniae NOT DETECTED NOT DETECTED Final   Streptococcus pyogenes NOT DETECTED NOT DETECTED Final   Acinetobacter baumannii NOT DETECTED NOT DETECTED Final   Enterobacteriaceae species NOT DETECTED NOT DETECTED Final   Enterobacter cloacae complex NOT DETECTED NOT DETECTED Final   Escherichia coli NOT DETECTED NOT DETECTED Final   Klebsiella oxytoca NOT DETECTED NOT DETECTED Final   Klebsiella pneumoniae NOT DETECTED NOT DETECTED Final   Proteus species NOT DETECTED NOT DETECTED Final   Serratia marcescens NOT DETECTED NOT DETECTED Final   Haemophilus influenzae NOT DETECTED NOT DETECTED Final   Neisseria meningitidis NOT DETECTED NOT DETECTED Final   Pseudomonas aeruginosa NOT DETECTED NOT DETECTED Final   Candida albicans NOT DETECTED NOT DETECTED Final   Candida glabrata NOT DETECTED NOT DETECTED Final   Candida krusei NOT DETECTED NOT DETECTED Final   Candida parapsilosis NOT DETECTED NOT DETECTED Final   Candida tropicalis NOT DETECTED NOT DETECTED Final    Comment: Performed at [facility]  Lab, [address] [place], [place] [address]  Blood culture (routine x 2)     Status: Abnormal   Collection Time: 10/24/18  7:24 AM   Specimen: BLOOD  Result Value Ref Range Status   Specimen Description BLOOD RIGHT ANTECUBITAL  Final   Special Requests   Final    BOTTLES DRAWN AEROBIC AND ANAEROBIC Blood Culture results may not be optimal due to an inadequate volume of blood received in culture bottles   Culture  Setup Time   Final    GRAM POSITIVE COCCI IN CLUSTERS AEROBIC BOTTLE ONLY CRITICAL VALUE NOTED.  VALUE IS CONSISTENT WITH PREVIOUSLY REPORTED AND CALLED VALUE.    Culture (A)  Final    STAPHYLOCOCCUS AUREUS SUSCEPTIBILITIES PERFORMED ON PREVIOUS CULTURE WITHIN THE LAST 5 DAYS. Performed at [facility], [place] [address] [place], [place] [address]    Report Status 10/27/2018 FINAL  Final  Surgical pcr screen      Status: None   Collection Time: 10/25/18  8:39 AM   Specimen: Nasal Mucosa; Nasal Swab  Result Value Ref Range Status   MRSA, PCR NEGATIVE NEGATIVE Final   Staphylococcus aureus NEGATIVE NEGATIVE Final    Comment: (NOTE) The Xpert SA Assay (FDA approved for NASAL specimens in patients 76 years of age and older), is one component of a comprehensive surveillance program. It is not intended to diagnose infection nor to guide or monitor treatment. Performed at [facility], [place] [address] [place], [place] [address]   Aerobic/Anaerobic Culture (surgical/deep wound)     Status: None (Preliminary result)   Collection Time: 10/25/18  3:22 PM   Specimen: Joint, Other; Body Fluid  Result Value Ref Range Status   Specimen Description JOINT FLUID  Final   Special Requests   Final    RT STERNOCLAVICULAR JOINT SAMPLE A PT ON VANCOMYCIN   Gram Stain   Final    FEW WBC PRESENT,BOTH PMN AND MONONUCLEAR NO ORGANISMS SEEN Performed at [facility], [address] [place], [place] [address]    Culture   Final    RARE METHICILLIN RESISTANT STAPHYLOCOCCUS AUREUS NO ANAEROBES ISOLATED; CULTURE IN PROGRESS FOR 5 DAYS    Report Status PENDING  Incomplete   Organism ID, Bacteria METHICILLIN RESISTANT STAPHYLOCOCCUS AUREUS  Final      Susceptibility   Methicillin resistant staphylococcus aureus - MIC*    CIPROFLOXACIN <=0.5 SENSITIVE Sensitive     ERYTHROMYCIN <=0.25 SENSITIVE Sensitive     GENTAMICIN <=0.5 SENSITIVE Sensitive     OXACILLIN >=4 RESISTANT Resistant     TETRACYCLINE <=1 SENSITIVE Sensitive     VANCOMYCIN <=0.5 SENSITIVE Sensitive     TRIMETH/SULFA <=10 SENSITIVE Sensitive     CLINDAMYCIN <=0.25 SENSITIVE Sensitive     RIFAMPIN <=0.5 SENSITIVE Sensitive     Inducible Clindamycin NEGATIVE Sensitive     * RARE METHICILLIN RESISTANT STAPHYLOCOCCUS AUREUS  Culture, blood (routine x 2)     Status: None (Preliminary result)   Collection Time: 10/26/18 10:19 AM   Specimen:  BLOOD RIGHT HAND  Result Value Ref Range Status   Specimen Description BLOOD RIGHT HAND  Final   Special Requests   Final    BOTTLES DRAWN AEROBIC ONLY Blood Culture adequate volume   Culture   Final    NO GROWTH 1 DAY Performed at [facility], [address] [place], [place] [address]    Report Status PENDING  Incomplete  Culture, blood (routine x 2)  Status: None (Preliminary result)   Collection Time: 10/26/18 10:19 AM   Specimen: BLOOD LEFT HAND  Result Value Ref Range Status   Specimen Description BLOOD LEFT HAND  Final   Special Requests   Final    BOTTLES DRAWN AEROBIC ONLY Blood Culture adequate volume   Culture   Final    NO GROWTH 1 DAY Performed at [facility], 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  1. MRSA sepsis/mitral valve endocarditis -both of the patient's blood cultures are now positive for MRSA. Agree with vancomycin dosed for goal trough of 15-20. The inciting event for her sepsis is unclear at this time. No skin lesions are noted aside from a small dried wound to her right gluteal cleft that is presently nontender. It would be unusual for the patient to have MRSA sepsisfrom an odontogenic source, particularly as she has not had repeat invasive procedures within the last 1 to 2 weeks. Furthermore, she does not have any oropharyngeal drainage or tenderness to the affected tooth at the present time. F/u repeat blood cultures to assess for clearance of her bacteremia (unrevealing thus far). Intra-op TEE confirmed MVIE. Her endocarditis/central source would explain distant hematogenous spread to her sternoclavicular joint. If repeat blood cxs remain negative  tomorrow, will place PICC. Awaiting vanc levels this evening to determine appropriate dose. Tentative ABX d/c date for vanc is 12/06/2018. HH/infusion liaison aware of possible d/c over weekend but now may also be dependent on securing wound VAC for home use as well.  2. RT sternoclavicular septic arthritis -imaging performed on admission with a CT angiogram showed sclerosis with erosive changes to her right sternoclavicular joint.I appreciate the CT surgery service taking the patient to the operating room promptly for debridement and wound VAC closure. The most favored hypothesis for her current infection would be prior injury from blunt trauma to her chest wall which may have healed but abnormally. It is common for many of these injuries to be accompanied by hematomas. Given that she has MRSA sepsis, I would favor the development of an occult infection that hematologically spread to an area of prior injury, thus causing her right sternoclavicular septic arthritis. Due to bony involvement and now confirmed MVIE, would anticipate a 6-week course of IV antibiotics to be needed.  3. Leukocytosis -promptly resolved with empiric ABX and sternoclavicular joint. Likely reactive from the patient's septic arthritis and MRSA sepsis. Check CBC with differential daily until within normal limits. Anticipate a response with empiric antibiotics and debridement to her right [place] joint. Wiill give one time dose of miralax tonight to promote bowel motility to avoid ileus.

## 2018-10-27 NOTE — TOC Initial Note (Addendum)
Transition of Care [facility]) - Initial/Assessment Note    Patient Details  Name: Tiffany Bullock MRN: [id number] Date of Birth: 1960/08/21  [facility] CM/SW Contact:    Maryclare Labrador, RN Phone Number: 10/27/2018, 10:58 AM  Clinical Narrative:     PTA independent from home.  Pt aware that she will discharge home with both IV antibiotics and wound vac.  [employer] was consulted via I&D - pt in agreement.  Pt informed CM that she has [employer] - information was taken by her daughter to admitting yesterday.  CM provided medicare.gov HH list choice however pt informed of barriers with locating [place] with insurance - pt is agreeable with CM contacting local agencies to see who will accept insurance.   CVTS and attending made aware of barriers with [facility].   CM elevated barrier with HH to [facility] leadership.   CVTS in alignment to use [employer] for wound vac - pt in agreement.  CM faxed all requested documentation to Olivia Mackie with [employer], [employer] given Dr Everrett Coombe email so prescription can be sent and signed via docu sign - Dr Servando Snare will then email signed form back to [facility] (Dr Servando Snare aware).  [employer] confirms email sent to DR Servando Snare.   Update: [employer] has not received completed wound vac form.  CVTS aware it has been emailed and will complete and send back to [facility].  Per CVTS pt will likely remain in house for at least a week.   [employer] contact information; Leontine Locket [phone]  [facility] denials [facility], [place], [facility], [facility], [place],  [place], [place], [facility]  [facility] under Review [facility] contacted via [employer]    Expected Discharge Plan: [place] Barriers to Discharge: No SNF bed, Continued Medical Work up   Patient Goals and CMS Choice Patient states their goals for this hospitalization and ongoing recovery are:: Pt states she would perform all ADLS independently CMS Medicare.gov Compare Post Acute Care list provided to:: Patient Choice offered to / list  presented to : Patient  Expected Discharge Plan and Services Expected Discharge Plan: [place] Choice: Durable Medical Equipment, Home Health Living arrangements for the past 2 months: Single Family Home                           HH Arranged: RN HH Agency: [employer] Date HH Agency Contacted: 10/27/18 Time [place]: 1054 Representative spoke with at [place]: Contacted diretly through [place] clinic - pt is in agreement with utilizing company  Prior Living Arrangements/Services Living arrangements for the past 2 months: [place] with:: Relatives Patient language and need for interpreter reviewed:: Yes Do you feel safe going back to the place where you live?: Yes      Need for Family Participation in Patient Care: Yes (Comment) Care giver support system in place?: Yes (comment)   Criminal Activity/Legal Involvement Pertinent to Current Situation/Hospitalization: No - Comment as needed  Activities of Daily Living Home Assistive Devices/Equipment: CPAP ADL Screening (condition at time of admission) Patient's cognitive ability adequate to safely complete daily activities?: Yes Is the patient deaf or have difficulty hearing?: No Does the patient have difficulty seeing, even when wearing glasses/contacts?: No Does the patient have difficulty concentrating, remembering, or making decisions?: No Patient able to express need for assistance with ADLs?: Yes Does the patient have difficulty dressing or bathing?: No Independently performs  ADLs?: Yes (appropriate for developmental age) Does the patient have difficulty walking or climbing stairs?: Yes Weakness of Legs: Both Weakness of Arms/Hands: None  Permission Sought/Granted   Permission granted to share information with : Yes, Verbal Permission Granted     Permission granted to share info w AGENCY: [employer], and referral agencies        Emotional Assessment    Attitude/Demeanor/Rapport: Engaged, Self-Confident, Gracious Affect (typically observed): Accepting, Adaptable Orientation: : Oriented to Self, Oriented to Place, Oriented to  Time, Oriented to Situation   Psych Involvement: No (comment)  Admission diagnosis:  Sternoclavicular joint pain, right [M25.511] Acute pain of right shoulder [M25.511] Septic arthritis of right sternoclavicular joint ([place]) [M00.9] Right-sided chest pain [R07.9] Patient Active Problem List   Diagnosis Date Noted  . MRSA bacteremia 10/25/2018  . Aortic atherosclerosis ([place]) 10/24/2018  . Septic arthritis of right [place] joint ([place]) 10/24/2018  . AKI (acute kidney injury) ([place]) 10/24/2018  . Essential hypertension 10/24/2018  . Personal history of colonic polyp - adenoma 11/05/2013  . Morbid obesity ([place]) 10/06/2012   PCP:  [facility]:   [place] ([place] [place] - [address] [place]) [place] [address] Phone: [phone] Fax: [phone]     Social Determinants of Health (SDOH) Interventions    Readmission Risk Interventions No flowsheet data found.

## 2018-10-27 NOTE — Consult Note (Signed)
[facility] Nurse wound consult note Reason for Consult: Left sternoclavicular abscess site, change VAC (NPWT) dressing.  Dr Servando Snare at bedside. Patient premedicated for pain.  Wound type: Infectious Pressure Injury POA: NA Measurement: 5 cm x 3 cm x 3.4 cm  Wound [id number] red Drainage (amount, consistency, odor) minimal serosanguinous  No odor Periwound:intact Dressing procedure/placement/frequency: Cleanse with NS and pat dry.  Apply black foam to wound bed.  Cover with drape.  Seal immediately achieved at 125 mmHg  Change M/W/F Will not follow at this time.  Please re-consult if needed.  Domenic Moras MSN, RN, FNP-BC [facility]ON Wound, Ostomy, Continence Nurse Pager [phone]

## 2018-10-27 NOTE — Progress Notes (Signed)
TCTS DAILY ICU PROGRESS NOTE                   [address]            [place] [address]          [phone]   2 Days Post-Op Procedure(s) (LRB): I&D Right sternoclavicular joint (Right) TRANSESOPHAGEAL ECHOCARDIOGRAM (TEE) (N/A)  Total Length of Stay:  LOS: 3 days   Subjective: Up to chair , alert, feels better, decreased pain at [place] joint right ambulated in halls yetserday  Objective: Vital signs in last 24 hours: Temp:  [98.1 F (36.7 C)-98.3 F (36.8 C)] 98.3 F (36.8 C) (09/04 0500) Pulse Rate:  [79-86] 79 (09/04 0500) Cardiac Rhythm: Normal sinus rhythm (09/03 2100) Resp:  [14-18] 16 (09/04 0500) BP: (131-155)/(75-83) 132/78 (09/04 0500) SpO2:  [89 %-97 %] 96 % (09/04 0500)  Filed Weights   10/24/18 1043 10/25/18 1434  Weight: (!) 173 kg (!) 173 kg    Weight change:    Hemodynamic parameters for last 24 hours:    Intake/Output from previous day: 09/03 0701 - 09/04 0700 In: 720 [P.O.:720] Out: 25 [Drains:25]  Intake/Output this shift: No intake/output data recorded.  Current Meds: Scheduled Meds: . enoxaparin (LOVENOX) injection  85 mg Subcutaneous Q24H  . irbesartan  300 mg Oral Daily   And  . hydrochlorothiazide  25 mg Oral Daily  . insulin aspart  0-5 Units Subcutaneous QHS  . insulin aspart  0-9 Units Subcutaneous TID WC  . sodium chloride flush  3 mL Intravenous Q12H   Continuous Infusions: . vancomycin 2,500 mg (10/26/18 1402)   PRN Meds:.acetaminophen **OR** acetaminophen, HYDROcodone-acetaminophen, HYDROmorphone (DILAUDID) injection  General appearance: alert, cooperative and no distress Neurologic: intact Heart: regular rate and rhythm, S1, S2 normal, no murmur, click, rub or gallop Lungs: clear to auscultation bilaterally Abdomen: soft, non-tender; bowel sounds normal; no masses,  no organomegaly Extremities: Homans sign is negative, no sign of DVT Wound: wound vac in place and functioning  Lab Results: CBC:  Recent Labs    10/26/18 0517 10/27/18 0441  WBC 12.2* 9.4  HGB 11.2* 11.4*  HCT 36.3 37.9  PLT 280 276   BMET:  Recent Labs    10/26/18 0517 10/27/18 0441  NA 136 138  K 3.8 4.1  CL 98 101  CO2 25 25  GLUCOSE 122* 97  BUN 15 20  CREATININE 0.86 1.06*  CALCIUM 9.0 9.2    CMET: Lab Results  Component Value Date   WBC 9.4 10/27/2018   HGB 11.4 (L) 10/27/2018   HCT 37.9 10/27/2018   PLT 276 10/27/2018   GLUCOSE 97 10/27/2018   CHOL 221 (H) 11/09/2012   TRIG 56 11/09/2012   HDL 77 11/09/2012   LDLCALC 133 (H) 11/09/2012   ALT 22 10/25/2018   AST 24 10/25/2018   NA 138 10/27/2018   K 4.1 10/27/2018   CL 101 10/27/2018   CREATININE 1.06 (H) 10/27/2018   BUN 20 10/27/2018   CO2 25 10/27/2018   INR 1.2 10/25/2018   HGBA1C 6.3 (H) 10/24/2018      PT/INR:  Recent Labs    10/25/18 0752  LABPROT 15.4*  INR 1.2   Radiology: No results found.  Results for orders placed or performed during the hospital encounter of 10/23/18  SARS Coronavirus 2 [facility] order, Performed in [facility] hospital lab) Nasopharyngeal Nasopharyngeal Swab     Status: None   Collection Time: 10/24/18  5:35  AM   Specimen: Nasopharyngeal Swab  Result Value Ref Range Status   SARS Coronavirus 2 NEGATIVE NEGATIVE Final    Comment: (NOTE) If result is NEGATIVE SARS-CoV-2 target nucleic acids are NOT DETECTED. The SARS-CoV-2 RNA is generally detectable in upper and lower  respiratory specimens during the acute phase of infection. The lowest  concentration of SARS-CoV-2 viral copies this assay can detect is 250  copies / mL. A negative result does not preclude SARS-CoV-2 infection  and should not be used as the sole basis for treatment or other  patient management decisions.  A negative result may occur with  improper specimen collection / handling, submission of specimen other  than nasopharyngeal swab, presence of viral mutation(s) within the  areas targeted by this assay, and  inadequate number of viral copies  (<250 copies / mL). A negative result must be combined with clinical  observations, patient history, and epidemiological information. If result is POSITIVE SARS-CoV-2 target nucleic acids are DETECTED. The SARS-CoV-2 RNA is generally detectable in upper and lower  respiratory specimens dur ing the acute phase of infection.  Positive  results are indicative of active infection with SARS-CoV-2.  Clinical  correlation with patient history and other diagnostic information is  necessary to determine patient infection status.  Positive results do  not rule out bacterial infection or co-infection with other viruses. If result is PRESUMPTIVE POSTIVE SARS-CoV-2 nucleic acids MAY BE PRESENT.   A presumptive positive result was obtained on the submitted specimen  and confirmed on repeat testing.  While 2019 novel coronavirus  (SARS-CoV-2) nucleic acids may be present in the submitted sample  additional confirmatory testing may be necessary for epidemiological  and / or clinical management purposes  to differentiate between  SARS-CoV-2 and other Sarbecovirus currently known to infect humans.  If clinically indicated additional testing with an alternate test  methodology [phone]) is advised. The SARS-CoV-2 RNA is generally  detectable in upper and lower respiratory sp ecimens during the acute  phase of infection. The expected result is Negative. Fact Sheet for Patients:  StrictlyIdeas.no Fact Sheet for Healthcare Providers: BankingDealers.co.za This test is not yet approved or cleared by the [place] FDA and has been authorized for detection and/or diagnosis of SARS-CoV-2 by FDA under an Emergency Use Authorization (EUA).  This EUA will remain in effect (meaning this test can be used) for the duration of the COVID-19 declaration under Section 564(b)(1) of the Act, 21 U.S.C. section 360bbb-3(b)(1), unless the  authorization is terminated or revoked sooner. Performed at [facility], [place] [address] [place], [place] [address]   Blood culture (routine x 2)     Status: Abnormal (Preliminary result)   Collection Time: 10/24/18  7:06 AM   Specimen: BLOOD LEFT FOREARM  Result Value Ref Range Status   Specimen Description BLOOD LEFT FOREARM  Final   Special Requests   Final    BOTTLES DRAWN AEROBIC AND ANAEROBIC Blood Culture adequate volume   Culture  Setup Time   Final    AEROBIC BOTTLE ONLY GRAM POSITIVE COCCI CRITICAL RESULT CALLED TO, READ BACK BY AND VERIFIED WITH: J. LEDFORD,PHARMD 0421 10/25/2018 T. TYSOR    Culture (A)  Final    STAPHYLOCOCCUS AUREUS SUSCEPTIBILITIES TO FOLLOW Performed at [facility], [place] [address] [place], [place] [address]    Report Status PENDING  Incomplete  Blood Culture ID Panel (Reflexed)     Status: Abnormal   Collection Time: 10/24/18  7:06 AM  Result  Value Ref Range Status   Enterococcus species NOT DETECTED NOT DETECTED Final   Listeria monocytogenes NOT DETECTED NOT DETECTED Final   Staphylococcus species DETECTED (A) NOT DETECTED Final    Comment: CRITICAL RESULT CALLED TO, READ BACK BY AND VERIFIED WITH: J. LEDFORD,PHARMD 0421 10/25/2018 T. TYSOR    Staphylococcus aureus (BCID) DETECTED (A) NOT DETECTED Final    Comment: Methicillin (oxacillin)-resistant Staphylococcus aureus (MRSA). MRSA is predictably resistant to beta-lactam antibiotics (except ceftaroline). Preferred therapy is vancomycin unless clinically contraindicated. Patient requires contact precautions if  hospitalized. CRITICAL RESULT CALLED TO, READ BACK BY AND VERIFIED WITH: J. LEDFORD,PHARMD 0421 10/25/2018 T. TYSOR    Methicillin resistance DETECTED (A) NOT DETECTED Final    Comment: CRITICAL RESULT CALLED TO, READ BACK BY AND VERIFIED WITH: J. LEDFORD,PHARMD 0421 10/25/2018 T. TYSOR    Streptococcus species NOT DETECTED NOT DETECTED Final   Streptococcus agalactiae  NOT DETECTED NOT DETECTED Final   Streptococcus pneumoniae NOT DETECTED NOT DETECTED Final   Streptococcus pyogenes NOT DETECTED NOT DETECTED Final   Acinetobacter baumannii NOT DETECTED NOT DETECTED Final   Enterobacteriaceae species NOT DETECTED NOT DETECTED Final   Enterobacter cloacae complex NOT DETECTED NOT DETECTED Final   Escherichia coli NOT DETECTED NOT DETECTED Final   Klebsiella oxytoca NOT DETECTED NOT DETECTED Final   Klebsiella pneumoniae NOT DETECTED NOT DETECTED Final   Proteus species NOT DETECTED NOT DETECTED Final   Serratia marcescens NOT DETECTED NOT DETECTED Final   Haemophilus influenzae NOT DETECTED NOT DETECTED Final   Neisseria meningitidis NOT DETECTED NOT DETECTED Final   Pseudomonas aeruginosa NOT DETECTED NOT DETECTED Final   Candida albicans NOT DETECTED NOT DETECTED Final   Candida glabrata NOT DETECTED NOT DETECTED Final   Candida krusei NOT DETECTED NOT DETECTED Final   Candida parapsilosis NOT DETECTED NOT DETECTED Final   Candida tropicalis NOT DETECTED NOT DETECTED Final    Comment: Performed at [facility], [place]. [address], [place], [place] [address]  Blood culture (routine x 2)     Status: Abnormal (Preliminary result)   Collection Time: 10/24/18  7:24 AM   Specimen: BLOOD  Result Value Ref Range Status   Specimen Description BLOOD RIGHT ANTECUBITAL  Final   Special Requests   Final    BOTTLES DRAWN AEROBIC AND ANAEROBIC Blood Culture results may not be optimal due to an inadequate volume of blood received in culture bottles   Culture  Setup Time   Final    GRAM POSITIVE COCCI IN CLUSTERS AEROBIC BOTTLE ONLY CRITICAL VALUE NOTED.  VALUE IS CONSISTENT WITH PREVIOUSLY REPORTED AND CALLED VALUE. Performed at [facility], [place] [address], [place], [place] [address]    Culture STAPHYLOCOCCUS AUREUS (A)  Final   Report Status PENDING  Incomplete  Surgical pcr screen     Status: None   Collection Time: 10/25/18  8:39 AM   Specimen:  Nasal Mucosa; Nasal Swab  Result Value Ref Range Status   MRSA, PCR NEGATIVE NEGATIVE Final   Staphylococcus aureus NEGATIVE NEGATIVE Final    Comment: (NOTE) The Xpert SA Assay (FDA approved for NASAL specimens in patients 58 years of age and older), is one component of a comprehensive surveillance program. It is not intended to diagnose infection nor to guide or monitor treatment. Performed at [facility], [place] [address], [place],  [address]   Aerobic/Anaerobic Culture (surgical/deep wound)     Status: None (Preliminary result)   Collection Time: 10/25/18  3:22 PM  Specimen: Joint, Other; Body Fluid  Result Value Ref Range Status   Specimen Description JOINT FLUID  Final   Special Requests   Final    RT STERNOCLAVICULAR JOINT SAMPLE A PT ON VANCOMYCIN   Gram Stain   Final    FEW WBC PRESENT,BOTH PMN AND MONONUCLEAR NO ORGANISMS SEEN Performed at [facility], [address] [place], [place] [address]    Culture RARE STAPHYLOCOCCUS AUREUS  Final   Report Status PENDING  Incomplete   Assessment/Plan: S/P Procedure(s) (LRB): I&D Right sternoclavicular joint (Right) TRANSESOPHAGEAL ECHOCARDIOGRAM (TEE) (N/A) Mobilize Feels better Change wound vac today and mwf pic line and iv antibiotics per ID    Grace Isaac 10/27/2018 7:53 AM

## 2018-10-28 LAB — CBC
HCT: 36.9 % (ref 36.0–46.0)
Hemoglobin: 11.2 g/dL — ABNORMAL LOW (ref 12.0–15.0)
MCH: 25.7 pg — ABNORMAL LOW (ref 26.0–34.0)
MCHC: 30.4 g/dL (ref 30.0–36.0)
MCV: 84.8 fL (ref 80.0–100.0)
Platelets: 324 10*3/uL (ref 150–400)
RBC: 4.35 MIL/uL (ref 3.87–5.11)
RDW: 15.3 % (ref 11.5–15.5)
WBC: 8 10*3/uL (ref 4.0–10.5)
nRBC: 0 % (ref 0.0–0.2)

## 2018-10-28 LAB — BASIC METABOLIC PANEL
Anion gap: 11 (ref 5–15)
BUN: 18 mg/dL (ref 6–20)
CO2: 26 mmol/L (ref 22–32)
Calcium: 9.1 mg/dL (ref 8.9–10.3)
Chloride: 100 mmol/L (ref 98–111)
Creatinine, Ser: 0.99 mg/dL (ref 0.44–1.00)
GFR calc Af Amer: >60 mL/min (ref >60)
GFR calc non Af Amer: >60 mL/min (ref >60)
Glucose, Bld: 102 mg/dL — ABNORMAL HIGH (ref 70–99)
Potassium: 3.6 mmol/L (ref 3.5–5.1)
Sodium: 137 mmol/L (ref 135–145)

## 2018-10-28 LAB — VANCOMYCIN, TROUGH: Vancomycin Tr: 9 ug/mL — ABNORMAL LOW (ref 15–20)

## 2018-10-28 LAB — GLUCOSE, CAPILLARY
Glucose-Capillary: 96 mg/dL (ref 70–99)
Glucose-Capillary: 88 mg/dL (ref 70–99)
Glucose-Capillary: 122 mg/dL — ABNORMAL HIGH (ref 70–99)
Glucose-Capillary: 114 mg/dL — ABNORMAL HIGH (ref 70–99)

## 2018-10-28 MED ORDER — VANCOMYCIN HCL 10 G IV SOLR
2250.0000 mg | INTRAVENOUS | Status: DC
  Administered 2018-10-28 – 2018-10-31 (×4): 2250 mg via INTRAVENOUS
  Filled 2018-10-28 (×3): qty 2250
  Filled 2018-10-28: qty 2000
  Filled 2018-10-28 (×3): qty 2250

## 2018-10-28 NOTE — Plan of Care (Addendum)
?  Problem: Education: ?Goal: Knowledge of General Education information will improve ?Description: Including pain rating scale, medication(s)/side effects and non-pharmacologic comfort measures ?Outcome: Progressing ?  ?Problem: Health Behavior/Discharge Planning: ?Goal: Ability to manage health-related needs will improve ?Outcome: Progressing ?  ?Problem: Clinical Measurements: ?Goal: Ability to maintain clinical measurements within normal limits will improve ?Outcome: Progressing ?Goal: Will remain free from infection ?Outcome: Progressing ?Goal: Diagnostic test results will improve ?Outcome: Progressing ?Goal: Cardiovascular complication will be avoided ?Outcome: Progressing ?  ?Problem: Nutrition: ?Goal: Adequate nutrition will be maintained ?Outcome: Progressing ?  ?Problem: Coping: ?Goal: Level of anxiety will decrease ?Outcome: Progressing ?  ?Problem: Elimination: ?Goal: Will not experience complications related to bowel motility ?Outcome: Progressing ?Goal: Will not experience complications related to urinary retention ?Outcome: Progressing ?  ?Problem: Pain Managment: ?Goal: General experience of comfort will improve ?Outcome: Progressing ?  ?Problem: Safety: ?Goal: Ability to remain free from injury will improve ?Outcome: Progressing ?  ?Problem: Skin Integrity: ?Goal: Risk for impaired skin integrity will decrease ?Outcome: Progressing ?  ?

## 2018-10-28 NOTE — Progress Notes (Addendum)
      [address]       [place] [address]             [phone]      3 Days Post-Op Procedure(s) (LRB): I&D Right sternoclavicular joint (Right) TRANSESOPHAGEAL ECHOCARDIOGRAM (TEE) (N/A)   Subjective:  No new complaints.  Pain is better at [facility] joint  Objective: Vital signs in last 24 hours: Temp:  [98.2 F (36.8 C)-98.7 F (37.1 C)] 98.2 F (36.8 C) (09/05 0911) Pulse Rate:  [82-97] 89 (09/05 0945) Cardiac Rhythm: Normal sinus rhythm (09/05 0700) Resp:  [14-18] 14 (09/05 0911) BP: (108-159)/(67-82) 159/82 (09/05 0945) SpO2:  [94 %-100 %] 100 % (09/05 0911)  Intake/Output from previous day: 09/04 0701 - 09/05 0700 In: 483 [P.O.:480; I.V.:3] Out: -  Intake/Output this shift: Total I/O In: 240 [P.O.:240] Out: -   General appearance: alert, cooperative and no distress Heart: regular rate and rhythm Lungs: clear to auscultation bilaterally Abdomen: soft, non-tender; bowel sounds normal; no masses,  no organomegaly Extremities: extremities normal, atraumatic, no cyanosis or edema Wound: wound vac in place  Lab Results: Recent Labs    10/27/18 0441 10/28/18 0424  WBC 9.4 8.0  HGB 11.4* 11.2*  HCT 37.9 36.9  PLT 276 324   BMET:  Recent Labs    10/27/18 0441 10/28/18 0424  NA 138 137  K 4.1 3.6  CL 101 100  CO2 25 26  GLUCOSE 97 102*  BUN 20 18  CREATININE 1.06* 0.99  CALCIUM 9.2 9.1    PT/INR: No results for input(s): LABPROT, INR in the last 72 hours. ABG    Component Value Date/Time   PHART 7.465 (H) 10/25/2018 0935   HCO3 28.1 (H) 10/25/2018 0935   O2SAT 89.7 10/25/2018 0935   CBG (last 3)  Recent Labs    10/27/18 1637 10/27/18 2103 10/28/18 0622  GLUCAP 94 104* 96    Assessment/Plan: S/P Procedure(s) (LRB): I&D Right sternoclavicular joint (Right) TRANSESOPHAGEAL ECHOCARDIOGRAM (TEE) (N/A)  1. [place] Joint Abscess, S/P Debridement--- plan for wound vac change on Monday 2. ID- ABX and PICC line management per ID,  remains afebrile, no leukcytosis 3. Dispo- patient stable, wound vac change Monday, care per primary   LOS: 4 days    [place] Handler 10/28/2018 patient examined and medical record reviewed,agree with above note. Tharon Aquas Trigt III 10/28/2018

## 2018-10-28 NOTE — Progress Notes (Signed)
   Subjective: Patient denied acute complaints today.  She states that overall she feels much improved.  Is minimal pain to the [facility] joint at this time.  She continues to be afebrile, nondiaphoretic, and is not notably short of breath.  She is in agreement with the plan to wait the -48-hour repeat blood culture results and to place a PICC line subsequently.  She understand that she will need several weeks of IV antibiotics in order to clear this infection and likely a repeat echocardiogram in 3 to 4 weeks.  She is in agreement this plan.  All questions were answered.  Objective:  Vital signs in last 24 hours: Vitals:   10/27/18 1350 10/27/18 1651 10/27/18 2106 10/28/18 0531  BP: 108/67 130/76 (!) 147/74 129/71  Pulse: 94 86 97 82  Resp: 18 18    Temp: 98.3 F (36.8 C) 98.2 F (36.8 C) 98.7 F (37.1 C) 98.5 F (36.9 C)  TempSrc: Oral Oral Oral Oral  SpO2: 100% 98% 100% 94%  Weight:      Height:       General: A/O x4, in no acute distress, afebrile, nondiaphoretic HEENT: PEERL, EMO intact Cardio: RRR, no mrg's  Pulmonary: CTA bilaterally, no wheezing or crackles  MSK: [place] joint is only minimally tender to palpation, but appears clean with wound VAC intact and minimal sanguinous drainage. Psych: Appropriate affect, not depressed in appearance, engages well  Assessment/Plan:  Principal Problem:   MRSA bacteremia Active Problems:   Septic arthritis of right [place] joint ([place])   AKI (acute kidney injury) ([place])   Essential hypertension  Patient is a 58 year old female who initially presented with shoulder pain.  She was noted to have [place] joint erosion on CT scan of the chest was subsequently taken for I&D due to the presence of MRSA bacteremia.  She continues to improve markedly insulin course to complete 6 weeks of outpatient IV vancomycin.  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  However, she was noted on TEE  to have endocarditis of the native mitral valve and will require 6 weeks of IV vancomycin and a repeat echocardiogram in 3-4 weeks.  Final day of IV antibiotics is tentatively set at December 06, 2018. - OPAT Order placed Spoke with Abby in [place] lab who stated that there was no growth x48 hours on the repeat blood cultures.  -PICC line request placed given negative blood cultures x48 hours -Continue IV vancomycin following trough levels - Appreciate ID recommendations - Appreciate thoracic surgery's recommendations regarding wound VAC care  Diet: Regular DVT PPX: enoxaparin Fluids: N/A Code: Full  Dispo: Anticipated discharge in approximately  day(s).   Kathi Ludwig, MD 10/28/2018, 6:55 AM Pager: # [phone]

## 2018-10-28 NOTE — Progress Notes (Signed)
Spoke with RN er PICC order.  BC results not back thus far today.  Pt for possible d/c home tomorrow.  Will place PICC later today or in am.  RN states pt has PIV currently for meds.

## 2018-10-28 NOTE — Progress Notes (Signed)
Pharmacy Antibiotic Note  Tiffany Bullock is a 58 y.o. female admitted on 10/23/2018 with Septic arthritis of the [place] joint with MRSA bacteremia + endocarditis.Marland Kitchen  Pharmacy has been consulted for Vancomycin dosing.  ID: Septic arthritis of the [place] joint with MRSA bacteremia + endocarditis. Elevated CRP/sedimentation rate   - Afebrile 98.7. WBC down today 8.0 - SCr 0.99 down - 9/2 I&d of joint and placement of vac + TEE with small vegetation on mitral valve leaflet, A small vegetation on the lateral TV leaflet cannot be excluded  Vanco 9/1>>  Intially: Vancomycin 2000 mg IV Q 24 hrs. Expected AUC: 473.1, SCr used: 1.14 - 9/3: recalculated Vancomycin 2500mg  IV q 24h (AUC 456 with Scr 0.86) - 9/5: recalculated Vanc 2250mg  IV q24h (AUC ~520 with SCr 0.99 using empiric calculations)  9/1 Bcx: SA 9/1 BCID: MRSA 9/2 [place] joint fluid cx: Rare MRSA 9/3 Bcx: ngx2d   Plan: decrease Vanc to 2250mg  IV q 24h Monitor clinical picture, renal function, vanc levels at steady state if patient is still admitted F/U C&S   Height: 5\' 7"  (170.2 cm) Weight: (!) 381 lb 6.3 oz (173 kg) IBW/kg (Calculated) : 61.6  Temp (24hrs), Avg:98.3 F (36.8 C), Min:97.9 F (36.6 C), Max:98.7 F (37.1 C)  Recent Labs  Lab 10/23/18 2212 10/25/18 0353 10/25/18 0752 10/26/18 0517 10/27/18 0441 10/27/18 1613 10/27/18 1851 10/28/18 0424  WBC 11.3* 11.4* 11.8* 12.2* 9.4  --   --  8.0  CREATININE 1.14* 1.09* 0.96 0.86 1.06*  --   --  0.99  LATICACIDVEN 1.3  --   --   --   --   --   --   --   VANCOPEAK  --   --   --   --   --  50* 38  --     Estimated Creatinine Clearance: 106.4 mL/min (by C-G formula based on SCr of 0.99 mg/dL).    No Known Allergies   Brendolyn Patty, PharmD PGY2 Pharmacy Resident Phone [phone]  10/28/2018   3:54 PM

## 2018-10-28 NOTE — Progress Notes (Signed)
PHARMACY CONSULT NOTE FOR:  OUTPATIENT  PARENTERAL ANTIBIOTIC THERAPY (OPAT)  Indication: Septic arthritis of the [place] joint with MRSA bacteremia + endocarditis Regimen: Vancomycin 2250 mg IV Q24 hours End date: 12/06/2018  IV antibiotic discharge orders are pended. To discharging provider:  please sign these orders via discharge navigator,  Select New Orders & click on the button choice - Manage This Unsigned Work.     Thank you for allowing pharmacy to be a part of this patient's care.  Brendolyn Patty, PharmD PGY2 Pharmacy Resident Phone [phone]  10/28/2018   4:01 PM

## 2018-10-29 ENCOUNTER — Encounter (HOSPITAL_COMMUNITY): Payer: Self-pay | Admitting: *Deleted

## 2018-10-29 LAB — CBC
HCT: 36.7 % (ref 36.0–46.0)
Hemoglobin: 11.2 g/dL — ABNORMAL LOW (ref 12.0–15.0)
MCH: 25.8 pg — ABNORMAL LOW (ref 26.0–34.0)
MCHC: 30.5 g/dL (ref 30.0–36.0)
MCV: 84.6 fL (ref 80.0–100.0)
Platelets: 333 10*3/uL (ref 150–400)
RBC: 4.34 MIL/uL (ref 3.87–5.11)
RDW: 14.8 % (ref 11.5–15.5)
WBC: 7.9 10*3/uL (ref 4.0–10.5)
nRBC: 0 % (ref 0.0–0.2)

## 2018-10-29 LAB — BASIC METABOLIC PANEL
Anion gap: 9 (ref 5–15)
BUN: 14 mg/dL (ref 6–20)
CO2: 27 mmol/L (ref 22–32)
Calcium: 9.2 mg/dL (ref 8.9–10.3)
Chloride: 100 mmol/L (ref 98–111)
Creatinine, Ser: 0.95 mg/dL (ref 0.44–1.00)
GFR calc Af Amer: >60 mL/min (ref >60)
GFR calc non Af Amer: >60 mL/min (ref >60)
Glucose, Bld: 105 mg/dL — ABNORMAL HIGH (ref 70–99)
Potassium: 3.4 mmol/L — ABNORMAL LOW (ref 3.5–5.1)
Sodium: 136 mmol/L (ref 135–145)

## 2018-10-29 LAB — GLUCOSE, CAPILLARY
Glucose-Capillary: 91 mg/dL (ref 70–99)
Glucose-Capillary: 96 mg/dL (ref 70–99)
Glucose-Capillary: 116 mg/dL — ABNORMAL HIGH (ref 70–99)

## 2018-10-29 MED ORDER — POTASSIUM CHLORIDE CRYS ER 20 MEQ PO TBCR
40.0000 meq | EXTENDED_RELEASE_TABLET | Freq: Once | ORAL | Status: AC
  Administered 2018-10-29: 40 meq via ORAL
  Filled 2018-10-29: qty 2

## 2018-10-29 MED ORDER — SODIUM CHLORIDE 0.9% FLUSH: 10.0000 mL | INTRAVENOUS | Status: DC | PRN

## 2018-10-29 MED ORDER — BENZONATATE 100 MG PO CAPS
200.0000 mg | ORAL_CAPSULE | Freq: Three times a day (TID) | ORAL | Status: DC | PRN
  Administered 2018-10-29 – 2018-11-05 (×8): 200 mg via ORAL
  Filled 2018-10-29 (×8): qty 2

## 2018-10-29 NOTE — Progress Notes (Signed)
      [address]       [place] [address]             [phone]      4 Days Post-Op Procedure(s) (LRB): I&D Right sternoclavicular joint (Right) TRANSESOPHAGEAL ECHOCARDIOGRAM (TEE) (N/A)   Subjective:  New onset cough this morning.  States she needs some cough medicine for this.  Still having some pain moving her right shoulder, but states overall it is better than when she first got here.  Objective: Vital signs in last 24 hours: Temp:  [97.9 F (36.6 C)-98.9 F (37.2 C)] 98.1 F (36.7 C) (09/06 0755) Pulse Rate:  [74-95] 92 (09/06 0755) Cardiac Rhythm: Normal sinus rhythm (09/05 2000) Resp:  [14-16] 16 (09/06 0755) BP: (115-148)/(58-81) 145/81 (09/06 0755) SpO2:  [92 %-97 %] 97 % (09/06 0755)  Intake/Output from previous day: 09/05 0701 - 09/06 0700 In: 1730.6 [P.O.:720; IV Piggyback:1010.6] Out: 0   General appearance: alert, cooperative and no distress Heart: regular rate and rhythm Lungs: clear to auscultation bilaterally Wound: wound vac in place on right [place] joint, no surrounding erythema present, doesn't appear grossly swollen  Lab Results: Recent Labs    10/28/18 0424 10/29/18 0933  WBC 8.0 7.9  HGB 11.2* 11.2*  HCT 36.9 36.7  PLT 324 333   BMET:  Recent Labs    10/27/18 0441 10/28/18 0424  NA 138 137  K 4.1 3.6  CL 101 100  CO2 25 26  GLUCOSE 97 102*  BUN 20 18  CREATININE 1.06* 0.99  CALCIUM 9.2 9.1    PT/INR: No results for input(s): LABPROT, INR in the last 72 hours. ABG    Component Value Date/Time   PHART 7.465 (H) 10/25/2018 0935   HCO3 28.1 (H) 10/25/2018 0935   O2SAT 89.7 10/25/2018 0935   CBG (last 3)  Recent Labs    10/28/18 1710 10/28/18 2032 10/29/18 0633  GLUCAP 122* 114* 91    Assessment/Plan: S/P Procedure(s) (LRB): I&D Right sternoclavicular joint (Right) TRANSESOPHAGEAL ECHOCARDIOGRAM (TEE) (N/A)  1. Right [place] Joint Abscess- for wound vac change tomorrow by wound care 2. ID- Picc line  placed today, ABX per ID/Primary 3. Pulm- new onset cough, would like cough syrup will defer to primary 4. Dispo- patient stable, wound vac to be changed in AM per wound care, care per primary   LOS: 5 days    [place] Handler 10/29/2018

## 2018-10-29 NOTE — Progress Notes (Signed)
  Subjective:  Patient reports that she is doing well this morning.  She denies chest pain, shortness of breath, abdominal pain.  She reports that she is having increased mobility in her right arm and is having less pain.   Objective:    Vital Signs (last 24 hours): Vitals:   10/28/18 1713 10/28/18 2027 10/29/18 0629 10/29/18 0755  BP: (!) 148/58 (!) 143/69 115/62 (!) 145/81  Pulse: 95 91 74 92  Resp: 14   16  Temp: 98.3 F (36.8 C) 98.9 F (37.2 C) 98.3 F (36.8 C) 98.1 F (36.7 C)  TempSrc: Oral Oral Oral Oral  SpO2: 96% 92% 95% 97%  Weight:      Height:         Physical Exam: General Alert and answers questions appropriately, no acute distress  Cardiac Regular rate and rhythm, no murmurs, rubs, or gallops  Pulmonary Clear to auscultation bilaterally without wheezes, rhonchi, or rales  Abdominal Soft, non-tender, without distention. Bowel sounds present  Chest Wound vac in place over right lateral chest wall. PICC line in place  Extremities No peripheral edema      Assessment/Plan:   Principal Problem:   MRSA bacteremia Active Problems:   Septic arthritis of right [place] joint ([place])   AKI (acute kidney injury) ([place])   Essential hypertension   Patient is a 58 year old female who initially presented with right shoulder pain and was found to have [place] joint erosion on CT scan and was subsequently taken for I&D due to presence of MRSA bacteremia.  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    #MRSA bacteremia complicated by septic arthritis and native valve endocarditis: Source remains unclear. Plan is for 6-week course of outpatient IV vancomycin with repeat echocardiogram in 3 to 4 weeks.  Final day IV antibiotics tentatively set for December 06, 2018.  Patient is doing well with improvement in mobility and pain, no signs of systemic infection. * OPAT order placed * PICC line placed this AM * Continue IV antibiotics * Case management working on  wound vac procurement  Diet: Regular DVT PPx: enoxaparin Fluids: N/A  Dispo: Anticipated discharge in approximately 1-2 days  Jeanmarie Hubert, MD 10/29/2018, 2:06 PM Pager: [phone]

## 2018-10-29 NOTE — Progress Notes (Signed)
Peripherally Inserted Central Catheter/Midline Placement  The IV Nurse has discussed with the patient and/or persons authorized to consent for the patient, the purpose of this procedure and the potential benefits and risks involved with this procedure.  The benefits include less needle sticks, lab draws from the catheter, and the patient may be discharged home with the catheter. Risks include, but not limited to, infection, bleeding, blood clot (thrombus formation), and puncture of an artery; nerve damage and irregular heartbeat and possibility to perform a PICC exchange if needed/ordered by physician.  Alternatives to this procedure were also discussed.  B[place] Power PICC patient education guide, fact sheet on infection prevention and patient information card has been provided to patient /or left at bedside.    PICC/Midline Placement Documentation  PICC Single Lumen [id number] PICC Left Basilic 48 cm 0 cm (Active)  Indication for Insertion or Continuance of Line Home intravenous therapies (PICC only) 10/29/18 0948  Exposed Catheter (cm) 0 cm 10/29/18 0948  Site Assessment Clean;Dry;Intact 10/29/18 0948  Line Status Flushed;Saline locked;Blood return noted 10/29/18 0948  Dressing Type Transparent 10/29/18 0948  Dressing Status Clean;Dry;Intact;Antimicrobial disc in place 10/29/18 [place] checked and tightened;[place] changed 10/29/18 0948  Line Adjustment (NICU/IV Team Only) No 10/29/18 0948  Dressing Intervention New dressing 10/29/18 0948  Dressing Change Due 11/05/18 10/29/18 0948       Rolena Infante 10/29/2018, 9:49 AM

## 2018-10-29 NOTE — Plan of Care (Signed)
  Problem: Pain Managment: Goal: General experience of comfort will improve Outcome: Progressing   Problem: Safety: Goal: Ability to remain free from injury will improve Outcome: Progressing   Problem: Skin Integrity: Goal: Risk for impaired skin integrity will decrease Outcome: Progressing   

## 2018-10-30 DIAGNOSIS — Z8679 Personal history of other diseases of the circulatory system: Secondary | ICD-10-CM

## 2018-10-30 LAB — BASIC METABOLIC PANEL
Anion gap: 8 (ref 5–15)
BUN: 15 mg/dL (ref 6–20)
CO2: 25 mmol/L (ref 22–32)
Calcium: 8.8 mg/dL — ABNORMAL LOW (ref 8.9–10.3)
Chloride: 102 mmol/L (ref 98–111)
Creatinine, Ser: 0.96 mg/dL (ref 0.44–1.00)
GFR calc Af Amer: >60 mL/min (ref >60)
GFR calc non Af Amer: >60 mL/min (ref >60)
Glucose, Bld: 107 mg/dL — ABNORMAL HIGH (ref 70–99)
Potassium: 3.6 mmol/L (ref 3.5–5.1)
Sodium: 135 mmol/L (ref 135–145)

## 2018-10-30 LAB — CBC
HCT: 34.8 % — ABNORMAL LOW (ref 36.0–46.0)
Hemoglobin: 10.6 g/dL — ABNORMAL LOW (ref 12.0–15.0)
MCH: 25.9 pg — ABNORMAL LOW (ref 26.0–34.0)
MCHC: 30.5 g/dL (ref 30.0–36.0)
MCV: 85.1 fL (ref 80.0–100.0)
Platelets: 308 10*3/uL (ref 150–400)
RBC: 4.09 MIL/uL (ref 3.87–5.11)
RDW: 14.7 % (ref 11.5–15.5)
WBC: 9 10*3/uL (ref 4.0–10.5)
nRBC: 0 % (ref 0.0–0.2)

## 2018-10-30 LAB — GLUCOSE, CAPILLARY
Glucose-Capillary: 113 mg/dL — ABNORMAL HIGH (ref 70–99)
Glucose-Capillary: 113 mg/dL — ABNORMAL HIGH (ref 70–99)
Glucose-Capillary: 118 mg/dL — ABNORMAL HIGH (ref 70–99)
Glucose-Capillary: 117 mg/dL — ABNORMAL HIGH (ref 70–99)

## 2018-10-30 LAB — AEROBIC/ANAEROBIC CULTURE W GRAM STAIN (SURGICAL/DEEP WOUND)

## 2018-10-30 MED ORDER — VANCOMYCIN IV (FOR PTA / DISCHARGE USE ONLY): 2250.0000 mg | INTRAVENOUS | 0 refills | Status: DC

## 2018-10-30 NOTE — Consult Note (Signed)
[place] Nurse wound follow up Wound type: surgical  Measurement: 1.0cm x 4cm x 1.5cm  Wound bed: clean, pink, moist Drainage (amount, consistency, odor) scant in the VAC canister Periwound: intact  Dressing procedure/placement/frequency: Removed old NPWT dressing Filled wound with 1 piece of black foam. Sealed NPWT dressing at 179mm HG Patient received IV pain med  Patient tolerated procedure well   [place] nurse will continue to provide NPWT dressing changed due to the complexity of the dressing change.   : [place], [place], [place]

## 2018-10-30 NOTE — Progress Notes (Signed)
  Subjective:  Patient reports that she is doing well today.  Denies fever, chills, shortness of breath.   Objective:    Vital Signs (last 24 hours): Vitals:   10/29/18 1538 10/29/18 2041 10/30/18 0533 10/30/18 0818  BP: (!) 158/86 (!) 108/59 120/71 140/83  Pulse: 87 91 73 90  Resp: 14   18  Temp: 98.4 F (36.9 C) 98.4 F (36.9 C) 98 F (36.7 C) 98.6 F (37 C)  TempSrc: Oral Oral Oral Oral  SpO2: 100% 92% 98% 99%  Weight:      Height:        Physical Exam: General Alert and answers questions appropriately, no acute distress  Cardiac Regular rate and rhythm, no murmurs, rubs, or gallops  Extremities No peripheral edema     Assessment/Plan:   Principal Problem:   MRSA bacteremia Active Problems:   Septic arthritis of right [place] joint ([facility])   AKI (acute kidney injury) ([place])   Essential hypertension  Patient is a 58 year old female who initially presented with right shoulder pain and found to have [place] joint region on CT scan and was subsequently taken for I&D due to presence of MRSA bacteremia.  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  # MRSA bacteremia complicated by septic arthritis and native valve endocarditis: Source remains unclear.  Plan is for 6-week course of outpatient IV vancomycin with repeat echocardiogram in 3 to 4 weeks.  Final day of IV antibiotics tentatively set for December 06, 2018. * OPAT order placed * PICC line placed yesterday * Continue IV antibiotics * Care management is working on arranging home health  Diet: Regular DVT PPx: enoxaparin  Dispo: Anticipated discharge in 1-2 days   Jeanmarie Hubert, MD 10/30/2018, 10:34 AM Pager: [phone]

## 2018-10-30 NOTE — TOC Progression Note (Signed)
Transition of Care [facility]) - Progression Note    Patient Details  Name: Tiffany Bullock MRN: [id number] Date of Birth: [birth year]  [facility] CM/SW Contact  Bartholomew Crews, RN Phone Number: [phone] 10/30/2018, 2:20 PM  Clinical Narrative:    Spoke with liaison at [facility] - order form not signed yet - spoke with Junie Panning who advised someone will sign tomorrow if left on shadow chart - Will leave short form on shadow chart for signature tomorrow 9/8. Advised that [place] should not be a problem for approval for vac, however, a [place] agency who has capacity to accept this case has not been found yet. Noted arrangements in place for [facility] to follow for IV antibiotics with pending referral for possible HH.    Expected Discharge Plan: [place] Barriers to Discharge: No SNF bed, Continued Medical Work up  Expected Discharge Plan and Services Expected Discharge Plan: [place] Choice: Durable Medical Equipment, Home Health Living arrangements for the past 2 months: Single Family Home Expected Discharge Date: 10/30/18                         HH Arranged: RN HH Agency: [facility] Date HH Agency Contacted: 10/27/18 Time [place]: 1054 Representative spoke with at [place]: Contacted diretly through [place] clinic - pt is in agreement with utilizing company   Social Determinants of Health (SDOH) Interventions    Readmission Risk Interventions No flowsheet data found.

## 2018-10-30 NOTE — Progress Notes (Addendum)
      [address]       [place] [address]             [phone]      5 Days Post-Op Procedure(s) (LRB): I&D Right sternoclavicular joint (Right) TRANSESOPHAGEAL ECHOCARDIOGRAM (TEE) (N/A)   Subjective:  No new complaints.  She is awaiting wound care to change her wound vac.  Objective: Vital signs in last 24 hours: Temp:  [98 F (36.7 C)-98.6 F (37 C)] 98.6 F (37 C) (09/07 0818) Pulse Rate:  [73-91] 90 (09/07 0818) Cardiac Rhythm: Normal sinus rhythm;Heart block (09/06 2100) Resp:  [14-18] 18 (09/07 0818) BP: (108-158)/(59-86) 140/83 (09/07 0818) SpO2:  [92 %-100 %] 99 % (09/07 0818)  Intake/Output from previous day: 09/06 0701 - 09/07 0700 In: 480 [P.O.:480] Out: 0  Intake/Output this shift: Total I/O In: 240 [P.O.:240] Out: -   General appearance: alert, cooperative and no distress Heart: regular rate and rhythm Lungs: clear to auscultation bilaterally Wound: wound vac in lace  Lab Results: Recent Labs    10/29/18 0933 10/30/18 0400  WBC 7.9 9.0  HGB 11.2* 10.6*  HCT 36.7 34.8*  PLT 333 308   BMET:  Recent Labs    10/29/18 0933 10/30/18 0400  NA 136 135  K 3.4* 3.6  CL 100 102  CO2 27 25  GLUCOSE 105* 107*  BUN 14 15  CREATININE 0.95 0.96  CALCIUM 9.2 8.8*    PT/INR: No results for input(s): LABPROT, INR in the last 72 hours. ABG    Component Value Date/Time   PHART 7.465 (H) 10/25/2018 0935   HCO3 28.1 (H) 10/25/2018 0935   O2SAT 89.7 10/25/2018 0935   CBG (last 3)  Recent Labs    10/29/18 1234 10/29/18 2043 10/30/18 0643  GLUCAP 96 116* 113*    Assessment/Plan: S/P Procedure(s) (LRB): I&D Right sternoclavicular joint (Right) TRANSESOPHAGEAL ECHOCARDIOGRAM (TEE) (N/A)  1. S/p debridement right [place] Joint abscess- patient due for wound vac change today, with wound care 2. ID- PICC line in place, will need home IV ABX per medicine 3. Dispo- patient stable, home wound vac and home health have yet to be  arranged.  They are pending insurance approval, wound vac to be changed today, continue care per primary   LOS: 6 days    Erin Barrett 10/30/2018  Vac sponge changed by wound nurse patient examined and medical record reviewed,agree with above note. Tharon Aquas Trigt III 10/30/2018

## 2018-10-30 NOTE — Progress Notes (Addendum)
[facility] for Infectious Disease  Date of Admission:  10/23/2018      Total days of antibiotics 7  Day 7 vancomycin           ASSESSMENT: Tiffany Bullock is a 58 y.o. female with MRSA bacteremia in the setting of R sternoclavicular joint septic arthritis. TEE indicating +native mitral valve endocarditis. She underwent debridement on 9/02 with CT surgery team. PICC line is in place and she is nearing readiness for discharge. Will treat with 6 weeks IV vancomycin through October 14th.   We discussed PICC line and limitations / maintenance. 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    PLAN: 1. OPAT orders as detailed below   OPAT ORDERS:  Diagnosis: Native Mitral Valve Endocarditis, Sternoclavicular septic arthritis and secondary bacteremia   Culture Result: MRSA   No Known Allergies  Discharge antibiotics: Per pharmacy protocol Vancomycin trough 15-20 (unless otherwise indicated)  Duration: 6 weeks   End Date: October 14th    [facility] Care and Maintenance Per Protocol _x_ Please pull PIC at completion of IV antibiotics __ Please leave PIC in place until doctor has seen patient or been notified  Labs weekly while on IV antibiotics: _x_ CBC with differential __ BMP _x_ BMP TWICE WEEKLY** __ CMP _x_ CRP _x_ ESR _x_ Vancomycin trough  Fax weekly labs to [phone]  Clinic Follow Up Appt: Dr. Prince [place] September 29th @ 9:15 a.m.    Principal Problem:   MRSA bacteremia Active Problems:   Septic arthritis of right [place] joint ([place])   Endocarditis of mitral valve   AKI (acute kidney injury) ([place])   Essential hypertension   . enoxaparin (LOVENOX) injection  85 mg Subcutaneous Q24H  . irbesartan  300 mg Oral Daily   And  . hydrochlorothiazide  25 mg Oral Daily  . insulin aspart  0-5 Units Subcutaneous QHS  . insulin aspart  0-9 Units  Subcutaneous TID WC  . sodium chloride flush  3 mL Intravenous Q12H    SUBJECTIVE: She is doing well today and soreness continues to improve over right shoulder   Review of Systems: Review of Systems  Constitutional: Negative for chills and fever.  HENT: Negative for tinnitus.   Eyes: Negative for blurred vision and photophobia.  Respiratory: Negative for cough and sputum production.   Cardiovascular: Negative for chest pain.  Gastrointestinal: Negative for diarrhea, nausea and vomiting.  Genitourinary: Negative for dysuria.  Musculoskeletal:       R shoulder / chest pain--improving  Skin: Negative for rash.  Neurological: Negative for headaches.    No Known Allergies  OBJECTIVE: Vitals:   10/29/18 2041 10/30/18 0533 10/30/18 0818 10/30/18 1301  BP: (!) 108/59 120/71 140/83 122/75  Pulse: 91 73 90 88  Resp:   18 18  Temp: 98.4 F (36.9 C) 98 F (36.7 C) 98.6 F (37 C) 98.3 F (36.8 C)  TempSrc: Oral Oral Oral Oral  SpO2: 92% 98% 99% 98%  Weight:      Height:       Body mass index is 59.73 kg/m.  Physical Exam Constitutional:      Comments: Seated in chair comfortably   HENT:     Mouth/Throat:     Mouth: Mucous membranes are moist.     Pharynx: Oropharynx is clear.  Neck:     Musculoskeletal: Normal  range of motion.  Cardiovascular:     Rate and Rhythm: Normal rate.  Pulmonary:     Effort: Pulmonary effort is normal.     Breath sounds: Normal breath sounds.  Musculoskeletal:     Comments: R [place] with wound vac in place. No drainage in canister. Slightly tender around the site.   Neurological:     Mental Status: She is alert.   LUE PICC line - clean/dry dressing. Insertion site w/o erythema, tenderness, drainage, cording or distal swelling of affected extremity    Lab Results Lab Results  Component Value Date   WBC 9.0 10/30/2018   HGB 10.6 (L) 10/30/2018   HCT 34.8 (L) 10/30/2018   MCV 85.1 10/30/2018   PLT 308 10/30/2018    Lab Results   Component Value Date   CREATININE 0.96 10/30/2018   BUN 15 10/30/2018   NA 135 10/30/2018   K 3.6 10/30/2018   CL 102 10/30/2018   CO2 25 10/30/2018    Lab Results  Component Value Date   ALT 22 10/25/2018   AST 24 10/25/2018   ALKPHOS 79 10/25/2018   BILITOT 1.0 10/25/2018     Microbiology: Recent Results (from the past 240 hour(s))  SARS Coronavirus 2 [facility] order, Performed in [facility] hospital lab) Nasopharyngeal Nasopharyngeal Swab     Status: None   Collection Time: 10/24/18  5:35 AM   Specimen: Nasopharyngeal Swab  Result Value Ref Range Status   SARS Coronavirus 2 NEGATIVE NEGATIVE Final    Comment: (NOTE) If result is NEGATIVE SARS-CoV-2 target nucleic acids are NOT DETECTED. The SARS-CoV-2 RNA is generally detectable in upper and lower  respiratory specimens during the acute phase of infection. The lowest  concentration of SARS-CoV-2 viral copies this assay can detect is 250  copies / mL. A negative result does not preclude SARS-CoV-2 infection  and should not be used as the sole basis for treatment or other  patient management decisions.  A negative result may occur with  improper specimen collection / handling, submission of specimen other  than nasopharyngeal swab, presence of viral mutation(s) within the  areas targeted by this assay, and inadequate number of viral copies  (<250 copies / mL). A negative result must be combined with clinical  observations, patient history, and epidemiological information. If result is POSITIVE SARS-CoV-2 target nucleic acids are DETECTED. The SARS-CoV-2 RNA is generally detectable in upper and lower  respiratory specimens dur ing the acute phase of infection.  Positive  results are indicative of active infection with SARS-CoV-2.  Clinical  correlation with patient history and other diagnostic information is  necessary to determine patient infection status.  Positive results do  not rule out bacterial infection or  co-infection with other viruses. If result is PRESUMPTIVE POSTIVE SARS-CoV-2 nucleic acids MAY BE PRESENT.   A presumptive positive result was obtained on the submitted specimen  and confirmed on repeat testing.  While 2019 novel coronavirus  (SARS-CoV-2) nucleic acids may be present in the submitted sample  additional confirmatory testing may be necessary for epidemiological  and / or clinical management purposes  to differentiate between  SARS-CoV-2 and other Sarbecovirus currently known to infect humans.  If clinically indicated additional testing with an alternate test  methodology [phone]) is advised. The SARS-CoV-2 RNA is generally  detectable in upper and lower respiratory sp ecimens during the acute  phase of infection. The expected result is Negative. Fact Sheet for Patients:  StrictlyIdeas.no Fact Sheet for Healthcare Providers: BankingDealers.co.za This  test is not yet approved or cleared by the [place] and has been authorized for detection and/or diagnosis of SARS-CoV-2 by FDA under an Emergency Use Authorization (EUA).  This EUA will remain in effect (meaning this test can be used) for the duration of the COVID-19 declaration under Section 564(b)(1) of the Act, 21 U.S.C. section 360bbb-3(b)(1), unless the authorization is terminated or revoked sooner. Performed at [facility], [place] [address], [place], [place] [address]   Blood culture (routine x 2)     Status: Abnormal   Collection Time: 10/24/18  7:06 AM   Specimen: BLOOD LEFT FOREARM  Result Value Ref Range Status   Specimen Description BLOOD LEFT FOREARM  Final   Special Requests   Final    BOTTLES DRAWN AEROBIC AND ANAEROBIC Blood Culture adequate volume   Culture  Setup Time   Final    AEROBIC BOTTLE ONLY GRAM POSITIVE COCCI CRITICAL RESULT CALLED TO, READ BACK BY AND VERIFIED WITH: J. LEDFORD,PHARMD 0421 10/25/2018 Mena Goes Performed at [facility], [place] [address] [place], [place] [address]    Culture METHICILLIN RESISTANT STAPHYLOCOCCUS AUREUS (A)  Final   Report Status 10/27/2018 FINAL  Final   Organism ID, Bacteria METHICILLIN RESISTANT STAPHYLOCOCCUS AUREUS  Final      Susceptibility   Methicillin resistant staphylococcus aureus - MIC*    CIPROFLOXACIN <=0.5 SENSITIVE Sensitive     ERYTHROMYCIN <=0.25 SENSITIVE Sensitive     GENTAMICIN <=0.5 SENSITIVE Sensitive     OXACILLIN >=4 RESISTANT Resistant     TETRACYCLINE <=1 SENSITIVE Sensitive     VANCOMYCIN <=0.5 SENSITIVE Sensitive     TRIMETH/SULFA <=10 SENSITIVE Sensitive     CLINDAMYCIN <=0.25 SENSITIVE Sensitive     RIFAMPIN <=0.5 SENSITIVE Sensitive     Inducible Clindamycin NEGATIVE Sensitive     * METHICILLIN RESISTANT STAPHYLOCOCCUS AUREUS  Blood Culture ID Panel (Reflexed)     Status: Abnormal   Collection Time: 10/24/18  7:06 AM  Result Value Ref Range Status   Enterococcus species NOT DETECTED NOT DETECTED Final   Listeria monocytogenes NOT DETECTED NOT DETECTED Final   Staphylococcus species DETECTED (A) NOT DETECTED Final    Comment: CRITICAL RESULT CALLED TO, READ BACK BY AND VERIFIED WITH: J. LEDFORD,PHARMD 0421 10/25/2018 T. TYSOR    Staphylococcus aureus (BCID) DETECTED (A) NOT DETECTED Final    Comment: Methicillin (oxacillin)-resistant Staphylococcus aureus (MRSA). MRSA is predictably resistant to beta-lactam antibiotics (except ceftaroline). Preferred therapy is vancomycin unless clinically contraindicated. Patient requires contact precautions if  hospitalized. CRITICAL RESULT CALLED TO, READ BACK BY AND VERIFIED WITH: J. LEDFORD,PHARMD 0421 10/25/2018 T. TYSOR    Methicillin resistance DETECTED (A) NOT DETECTED Final    Comment: CRITICAL RESULT CALLED TO, READ BACK BY AND VERIFIED WITH: J. LEDFORD,PHARMD 0421 10/25/2018 T. TYSOR    Streptococcus species NOT DETECTED NOT DETECTED Final   Streptococcus agalactiae NOT DETECTED NOT DETECTED Final    Streptococcus pneumoniae NOT DETECTED NOT DETECTED Final   Streptococcus pyogenes NOT DETECTED NOT DETECTED Final   Acinetobacter baumannii NOT DETECTED NOT DETECTED Final   Enterobacteriaceae species NOT DETECTED NOT DETECTED Final   Enterobacter cloacae complex NOT DETECTED NOT DETECTED Final   Escherichia coli NOT DETECTED NOT DETECTED Final   Klebsiella oxytoca NOT DETECTED NOT DETECTED Final   Klebsiella pneumoniae NOT DETECTED NOT DETECTED Final   Proteus species NOT DETECTED NOT DETECTED Final   Serratia marcescens NOT DETECTED NOT DETECTED Final   Haemophilus influenzae NOT  DETECTED NOT DETECTED Final   Neisseria meningitidis NOT DETECTED NOT DETECTED Final   Pseudomonas aeruginosa NOT DETECTED NOT DETECTED Final   Candida albicans NOT DETECTED NOT DETECTED Final   Candida glabrata NOT DETECTED NOT DETECTED Final   Candida krusei NOT DETECTED NOT DETECTED Final   Candida parapsilosis NOT DETECTED NOT DETECTED Final   Candida tropicalis NOT DETECTED NOT DETECTED Final    Comment: Performed at [facility], [place] [address] [place], [place] [address]  Blood culture (routine x 2)     Status: Abnormal   Collection Time: 10/24/18  7:24 AM   Specimen: BLOOD  Result Value Ref Range Status   Specimen Description BLOOD RIGHT ANTECUBITAL  Final   Special Requests   Final    BOTTLES DRAWN AEROBIC AND ANAEROBIC Blood Culture results may not be optimal due to an inadequate volume of blood received in culture bottles   Culture  Setup Time   Final    GRAM POSITIVE COCCI IN CLUSTERS AEROBIC BOTTLE ONLY CRITICAL VALUE NOTED.  VALUE IS CONSISTENT WITH PREVIOUSLY REPORTED AND CALLED VALUE.    Culture (A)  Final    STAPHYLOCOCCUS AUREUS SUSCEPTIBILITIES PERFORMED ON PREVIOUS CULTURE WITHIN THE LAST 5 DAYS. Performed at [facility], [place] [address], [place], [place] [address]    Report Status 10/27/2018 FINAL  Final  Surgical pcr screen     Status: None   Collection Time:  10/25/18  8:39 AM   Specimen: Nasal Mucosa; Nasal Swab  Result Value Ref Range Status   MRSA, PCR NEGATIVE NEGATIVE Final   Staphylococcus aureus NEGATIVE NEGATIVE Final    Comment: (NOTE) The Xpert SA Assay (FDA approved for NASAL specimens in patients 14 years of age and older), is one component of a comprehensive surveillance program. It is not intended to diagnose infection nor to guide or monitor treatment. Performed at [facility], [place] [address] [place], [place] [address]   Aerobic/Anaerobic Culture (surgical/deep wound)     Status: None   Collection Time: 10/25/18  3:22 PM   Specimen: Joint, Other; Body Fluid  Result Value Ref Range Status   Specimen Description JOINT FLUID  Final   Special Requests   Final    RT STERNOCLAVICULAR JOINT SAMPLE A PT ON VANCOMYCIN   Gram Stain   Final    FEW WBC PRESENT,BOTH PMN AND MONONUCLEAR NO ORGANISMS SEEN    Culture   Final    RARE METHICILLIN RESISTANT STAPHYLOCOCCUS AUREUS NO ANAEROBES ISOLATED Performed at [facility], [address], [place], [place] [address]    Report Status 10/30/2018 FINAL  Final   Organism ID, Bacteria METHICILLIN RESISTANT STAPHYLOCOCCUS AUREUS  Final      Susceptibility   Methicillin resistant staphylococcus aureus - MIC*    CIPROFLOXACIN <=0.5 SENSITIVE Sensitive     ERYTHROMYCIN <=0.25 SENSITIVE Sensitive     GENTAMICIN <=0.5 SENSITIVE Sensitive     OXACILLIN >=4 RESISTANT Resistant     TETRACYCLINE <=1 SENSITIVE Sensitive     VANCOMYCIN <=0.5 SENSITIVE Sensitive     TRIMETH/SULFA <=10 SENSITIVE Sensitive     CLINDAMYCIN <=0.25 SENSITIVE Sensitive     RIFAMPIN <=0.5 SENSITIVE Sensitive     Inducible Clindamycin NEGATIVE Sensitive     * RARE METHICILLIN RESISTANT STAPHYLOCOCCUS AUREUS  Culture, blood (routine x 2)     Status: None (Preliminary result)   Collection Time: 10/26/18 10:19 AM   Specimen: BLOOD RIGHT HAND  Result Value Ref Range Status  Specimen Description BLOOD RIGHT  HAND  Final   Special Requests   Final    BOTTLES DRAWN AEROBIC ONLY Blood Culture adequate volume   Culture   Final    NO GROWTH 4 DAYS Performed at [facility], [address], [place], [place] [address]    Report Status PENDING  Incomplete  Culture, blood (routine x 2)     Status: None (Preliminary result)   Collection Time: 10/26/18 10:19 AM   Specimen: BLOOD LEFT HAND  Result Value Ref Range Status   Specimen Description BLOOD LEFT HAND  Final   Special Requests   Final    BOTTLES DRAWN AEROBIC ONLY Blood Culture adequate volume   Culture   Final    NO GROWTH 4 DAYS Performed at [facility], [place] [address] [place], [place] [address]    Report Status PENDING  Incomplete    Janene Madeira, MSN, NP-C [facility] for Infectious Disease [facility] Cell: [phone] Pager: [phone]  _0 @ 1:31 PM

## 2018-10-30 NOTE — Progress Notes (Signed)
Spoke with Wendi from [facility], patient will not be discharged today due to no available wound care at home, with home health.  RN called internal medicine Dr. Berline Lopes, notified of situation. MD stated they will round on patient tomorrow.

## 2018-10-31 DIAGNOSIS — Z95828 Presence of other vascular implants and grafts: Secondary | ICD-10-CM

## 2018-10-31 LAB — GLUCOSE, CAPILLARY
Glucose-Capillary: 82 mg/dL (ref 70–99)
Glucose-Capillary: 96 mg/dL (ref 70–99)
Glucose-Capillary: 88 mg/dL (ref 70–99)
Glucose-Capillary: 79 mg/dL (ref 70–99)
Glucose-Capillary: 97 mg/dL (ref 70–99)

## 2018-10-31 LAB — CULTURE, BLOOD (ROUTINE X 2)
Culture: NO GROWTH
Culture: NO GROWTH
Special Requests: ADEQUATE
Special Requests: ADEQUATE

## 2018-10-31 LAB — VANCOMYCIN, PEAK: Vancomycin Pk: 46 ug/mL — ABNORMAL HIGH (ref 30–40)

## 2018-10-31 NOTE — Progress Notes (Addendum)
      [address]       [place] [address]             [phone]       6 Days Post-Op Procedure(s) (LRB): I&D Right sternoclavicular joint (Right) TRANSESOPHAGEAL ECHOCARDIOGRAM (TEE) (N/A)  Subjective: Patient sitting in chair, eating breakfast. No specific complaints this am.  Objective: Vital signs in last 24 hours: Temp:  [98.3 F (36.8 C)-98.6 F (37 C)] 98.5 F (36.9 C) (09/08 0300) Pulse Rate:  [82-91] 82 (09/08 0300) Cardiac Rhythm: Normal sinus rhythm;Heart block (09/07 2100) Resp:  [16-18] 17 (09/08 0300) BP: (101-122)/(67-75) 114/68 (09/08 0300) SpO2:  [98 %-100 %] 99 % (09/08 0300)      Intake/Output from previous day: 09/07 0701 - 09/08 0700 In: 720 [P.O.:720] Out: 0    Physical Exam:  Cardiovascular: RRR Pulmonary: Clear to auscultation bilaterally Wounds: Wound VAC in place.   Lab Results: CBC: Recent Labs    10/29/18 0933 10/30/18 0400  WBC 7.9 9.0  HGB 11.2* 10.6*  HCT 36.7 34.8*  PLT 333 308   BMET:  Recent Labs    10/29/18 0933 10/30/18 0400  NA 136 135  K 3.4* 3.6  CL 100 102  CO2 27 25  GLUCOSE 105* 107*  BUN 14 15  CREATININE 0.95 0.96  CALCIUM 9.2 8.8*    PT/INR: No results for input(s): LABPROT, INR in the last 72 hours. ABG:  INR: Will add last result for INR, ABG once components are confirmed Will add last 4 CBG results once components are confirmed  Assessment/Plan:  1. CV - SR. On HCTZ 25 mg daily and Irbesartan 300 mg daily 2.  Pulmonary - On room air. 3. ID-PICC line in place. 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 Disposition per primary service  Donielle M ZimmermanPA-C 10/31/2018,8:33 AM [phone]  Chart reviewed, patient examined, agree with above. The drainage is thin, serosanguinous.  Feels ok Plan VAC change tomorrow.

## 2018-10-31 NOTE — TOC Progression Note (Signed)
Transition of Care [facility]) - Progression Note    Patient Details  Name: Ketia Schuring MRN: [id number] Date of Birth: March 04, 1960  Transition of Care [facility]) CM/SW [place] RN, BSN, NCM-BC, [place] [phone] Phone Number: 10/31/2018, 9:48 AM  Clinical Narrative:    CM following for TOC needs. [employer] form faxed with confirmed receipt to Leontine Locket ([employer] rep). Barriers to DC continue to be no HH agency agreeable to accept the patients insurance. CM team will continue to contact [facility] agencies to see who will accept insurance.    Expected Discharge Plan: [place] Barriers to Discharge: No SNF bed, Continued Medical Work up  Expected Discharge Plan and Services Expected Discharge Plan: [place] Choice: Durable Medical Equipment, Home Health Living arrangements for the past 2 months: Single Family Home Expected Discharge Date: 10/30/18                         HH Arranged: RN HH Agency: [facility] Date HH Agency Contacted: 10/27/18 Time [place]: 1054 Representative spoke with at [place]: Contacted diretly through [place] [facility] - pt is in agreement with utilizing company   Social Determinants of Health (SDOH) Interventions    Readmission Risk Interventions No flowsheet data found.

## 2018-10-31 NOTE — Progress Notes (Addendum)
Noted difficulty finding a [place] agency; Monica Martinez Team Lead at [facility] [phone]) called for assistance with finding a [place] agency; awaiting a callback; [place]

## 2018-10-31 NOTE — Progress Notes (Addendum)
  Subjective:  Patient reports she is doing well this morning.  Denies chest pain, shortness of breath.  Patient understands the factors influencing timing of discharge and is comfortable with plan of care.   Objective:    Vital Signs (last 24 hours): Vitals:   10/30/18 1301 10/30/18 1950 10/31/18 0300 10/31/18 0916  BP: 122/75 101/67 114/68 (!) 158/74  Pulse: 88 91 82 89  Resp: 18 16 17 18   Temp: 98.3 F (36.8 C) 98.6 F (37 C) 98.5 F (36.9 C) 98 F (36.7 C)  TempSrc: Oral Oral Oral Oral  SpO2: 98% 100% 99% 100%  Weight:      Height:        Physical Exam: General Alert and answers questions appropriately, no acute distress  Cardiac Regular rate and rhythm, no murmurs, rubs, or gallops  Pulmonary Clear to auscultation bilaterally without wheezes, rhonchi, or rales      Assessment/Plan:   Principal Problem:   MRSA bacteremia Active Problems:   Septic arthritis of right [place] joint (HCC)   AKI (acute kidney injury) ([place])   Essential hypertension   Endocarditis of mitral valve  Patient is a 58 year old female initially presented with right shoulder pain and found to have SI joint erosion on CT scan was subsequently taken for I&D due to presence of MRSA bacteremia.  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  # MRSA bacteremia complicated by septic arthritis and native valve endocarditis: Source remains unclear, plan is for 6-week course of outpatient IV vancomycin with repeat echocardiogram in 3 to 4 weeks.  Final day of IV antibiotics tentatively set for December 06, 2018.  OPAT order placed, PICC line in place. * Difficulties have been encountered finding home health agency.  We appreciate care management continued work on this issue. *Continue IV vancomycin  Diet: Regular DVT Ppx: Lovenox Dispo: Anticipated discharge pending acquisition of home health agency.   Jeanmarie Hubert, MD 10/31/2018, 11:44 AM Pager: [phone]

## 2018-10-31 NOTE — Progress Notes (Signed)
Pharmacy Antibiotic Note  Tiffany Bullock is a 58 y.o. female admitted on 10/23/2018 with Septic arthritis of the [place] joint with MRSA bacteremia + endocarditis.Marland Kitchen  Pharmacy has been consulted for Vancomycin dosing.  ID: Septic arthritis of the [place] joint with MRSA bacteremia + endocarditis. Elevated CRP/sedimentation rate   - Tmax 98.6. WBC 9  - SCr down 0.96 - 9/2 I&d of joint and placement of vac + TEE with small vegetation on mitral valve leaflet, A small vegetation on the lateral TV leaflet cannot be excluded - 9/6 PICC placed  Antimicrobials this admission: Vanco 9/1>>  Dose adjustments this admission: Intially: Vancomycin 2000 mg IV Q 24 hrs. Goal AUC 400-550. Expected AUC: 473.1, SCr used: 1.14 - 9/3: recalculated Vancomycin 2500mg  IV q 24h (AUC 456 with Scr 0.86) - 9/5: recalculated Vanc 2250mg  IV q24h (AUC ~520 with SCr 0.99 using empiric calculations)             - if dosing vanc from 2 levels: VP 38, VT 9 (but not technically at steady state as only 2 doses of 2500 mg given since dose change 9/3) estimated AUC 551 with SCr 0.99  9/1 Bcx: SA 9/1 BCID: MRSA 9/2 [place] joint fluid cx: Rare MRSA 9/3 Bcx: ngf   Plan: Cont Vanc to 2250mg  IV q 24h Vanc peak tonight 1900, trough 9/9 1530 Monitor clinical picture, renal function, vanc levels at steady state if patient is still admitted F/U C&S   Height: 5\' 7"  (170.2 cm) Weight: (!) 381 lb 6.3 oz (173 kg) IBW/kg (Calculated) : 61.6  Temp (24hrs), Avg:98.5 F (36.9 C), Min:98.3 F (36.8 C), Max:98.6 F (37 C)  Recent Labs  Lab 10/26/18 0517 10/27/18 0441 10/27/18 1613 10/27/18 1851 10/28/18 0424 10/28/18 1437 10/29/18 0933 10/30/18 0400  WBC 12.2* 9.4  --   --  8.0  --  7.9 9.0  CREATININE 0.86 1.06*  --   --  0.99  --  0.95 0.96  VANCOTROUGH  --   --   --   --   --  9*  --   --   VANCOPEAK  --   --  50* 38  --   --   --   --     Estimated Creatinine Clearance: 108.4 mL/min (by C-G formula based on SCr of 0.96  mg/dL).    No Known Allergies   Gwenlyn Fudge - Student PharmD  10/31/2018   8:27 AM

## 2018-11-01 ENCOUNTER — Encounter (HOSPITAL_COMMUNITY): Payer: Self-pay | Admitting: Radiology

## 2018-11-01 ENCOUNTER — Inpatient Hospital Stay (HOSPITAL_COMMUNITY): Payer: BC Managed Care – PPO

## 2018-11-01 DIAGNOSIS — R202 Paresthesia of skin: Secondary | ICD-10-CM

## 2018-11-01 LAB — GLUCOSE, CAPILLARY
Glucose-Capillary: 98 mg/dL (ref 70–99)
Glucose-Capillary: 108 mg/dL — ABNORMAL HIGH (ref 70–99)
Glucose-Capillary: 73 mg/dL (ref 70–99)
Glucose-Capillary: 77 mg/dL (ref 70–99)

## 2018-11-01 LAB — BASIC METABOLIC PANEL
Anion gap: 11 (ref 5–15)
BUN: 17 mg/dL (ref 6–20)
CO2: 24 mmol/L (ref 22–32)
Calcium: 9.6 mg/dL (ref 8.9–10.3)
Chloride: 102 mmol/L (ref 98–111)
Creatinine, Ser: 1.26 mg/dL — ABNORMAL HIGH (ref 0.44–1.00)
GFR calc Af Amer: 55 mL/min — ABNORMAL LOW (ref >60)
GFR calc non Af Amer: 47 mL/min — ABNORMAL LOW (ref >60)
Glucose, Bld: 96 mg/dL (ref 70–99)
Potassium: 3.9 mmol/L (ref 3.5–5.1)
Sodium: 137 mmol/L (ref 135–145)

## 2018-11-01 LAB — VANCOMYCIN, TROUGH: Vancomycin Tr: 14 ug/mL — ABNORMAL LOW (ref 15–20)

## 2018-11-01 MED ORDER — VANCOMYCIN HCL 10 G IV SOLR
1750.0000 mg | INTRAVENOUS | Status: DC
  Administered 2018-11-01 – 2018-11-06 (×6): 1750 mg via INTRAVENOUS
  Filled 2018-11-01 (×7): qty 1750

## 2018-11-01 MED ORDER — METHOCARBAMOL 500 MG PO TABS
500.0000 mg | ORAL_TABLET | Freq: Three times a day (TID) | ORAL | Status: DC
  Administered 2018-11-01 – 2018-11-06 (×16): 500 mg via ORAL
  Filled 2018-11-01 (×16): qty 1

## 2018-11-01 NOTE — Care Management (Signed)
CM received the patients updated insurance card; the card has been updated in Epic. CM spoke to the patient to discuss the POC and current barriers to discharge, with the patient verbalizing understanding. Patient agreeable to a possible referral to the [facility] for her wound vac dressing changes. CM spoke to Nicole Kindred at the [facility] to discuss the referral. Patient unable to be scheduled until 11/15/18; the [facility] is also unable to manage the wound vac dressing changes 3 times/week. CM updated the patient. CM spoke to [employer] (Kindred at [employer]), with the liaison unable to accept the referral d/t safety issues. CM will continue to explore other resources.   Midge Minium RN, BSN, NCM-BC, ACM-RN [phone]

## 2018-11-01 NOTE — Plan of Care (Signed)
  Problem: Skin Integrity: Goal: Risk for impaired skin integrity will decrease Outcome: Progressing   Problem: Elimination: Goal: Will not experience complications related to bowel motility Outcome: Progressing   Problem: Clinical Measurements: Goal: Diagnostic test results will improve Outcome: Progressing

## 2018-11-01 NOTE — Progress Notes (Signed)
Pharmacy Antibiotic Note  Tiffany Bullock is a 58 y.o. female admitted on 10/23/2018 with septic arthritis of the [place] joint with MRSA bacteremia and endocarditis. Pharmacy has been consulted for Vancomycin dosing.      Last Vanc levels 9/5 and regimen adjusted from 2500 mg IV to 2250 mg IV q24hrs.   Vanc peak level after 9/8 dose = 46 mcg/ml. Trough level prior to 9/9 dose = 14 mcg/ml.   Calculated AUC 652.  Goal AUC 400-550.   Creatinine trended up to 1.26 today.  Continues on Losartan (for Valsartan) and HCTZ as prior to admission.  Plan:  Decrease Vancomycin to 1750 mg IV q24hrs.  Expected AUC:  507  Goal AUC 400-550.  Follow renal function.  Next bmet by 9/12.  Recheck levels at steady state.  OPAT orders updated for new dose.  Vanc stop date 12/06/18.  Height: 5\' 7"  (170.2 cm) Weight: (!) 381 lb 6.3 oz (173 kg) IBW/kg (Calculated) : 61.6  Temp (24hrs), Avg:98.3 F (36.8 C), Min:98 F (36.7 C), Max:98.5 F (36.9 C)  Recent Labs  Lab 10/26/18 0517 10/27/18 0441  10/27/18 1851 10/28/18 0424 10/28/18 1437 10/29/18 0933 10/30/18 0400 10/31/18 2000 11/01/18 1520  WBC 12.2* 9.4  --   --  8.0  --  7.9 9.0  --   --   CREATININE 0.86 1.06*  --   --  0.99  --  0.95 0.96  --  1.26*  VANCOTROUGH  --   --   --   --   --  9*  --   --   --  14*  VANCOPEAK  --   --    < > 38  --   --   --   --  46*  --    < > = values in this interval not displayed.    Estimated Creatinine Clearance: 82.6 mL/min (A) (by C-G formula based on SCr of 1.26 mg/dL (H)).    No Known Allergies  Antimicrobials this admission:  Vancomycin 9/1>>  Dose adjustments this admission:  9/3: empiric change from 2gm to 2500 mg IV q24hrs based on Scr improved  9/5: changed from 2500 mg to 2250 mg IV q24hrs based on levels  9/9: changed from 2250 mg to 1750 mg IV q24hrs based on levels  Microbiology results: 9/1 Blood x 2: MRSA 9/1 BCID: MRSA 9/2 MRSA PCR: negative 9/2 sternoclavicular joint fluid: Rare  MRSA 9/3 Blood x 2: negative 9/1 COVID: negative Thank you for allowing pharmacy to be a part of this patient's care.  Arty Baumgartner, [place] Pager: [phone] or phone: [phone] 11/01/2018 4:25 PM

## 2018-11-01 NOTE — Progress Notes (Addendum)
Tiffany Bullock Team Lead at [facility] [phone]) called again requesting help with establishing [place] services at discharge; awaiting callback; [place] RN CM will be following patient for progression of care - [phone]  10 am - Received call back from [place] with [facility]; patient has a termed ( expired insurance card). I talked to the patient and she stated that she renewed her insurance and her current insurance card is at home. She plans to have a family member go to her home to find her new card. [place]- unable to find a [place] agency that can provide [facility] for the patient; I talked to [facility], possibly referral to the [facility] for wound vac. Carolynn Sayers RN with [facility] is providing education for IV antibiotics; B Pennie Rushing Advanced Care Supervisor

## 2018-11-01 NOTE — Progress Notes (Signed)
  Subjective:  Patient reports she is doing well this morning.  Denies fever, chills.  Patient reports numbness/pain on the left side of her leg.  Patient thought it was due to lying in bed but she has been sitting in a chair most of the time.    Objective:    Vital Signs (last 24 hours): Vitals:   10/31/18 1402 10/31/18 2115 11/01/18 0451 11/01/18 0820  BP: 110/65 126/76 125/66 (!) 137/99  Pulse: 68 86 79 87  Resp: 18 14 16 18   Temp: 98.2 F (36.8 C) 98.5 F (36.9 C) 98.3 F (36.8 C) 98 F (36.7 C)  TempSrc: Oral Oral Oral Oral  SpO2: 100% 97% 98% 97%  Weight:      Height:        Physical Exam: General Alert and answers questions appropriately, no acute distress  MSK Moves extremities without difficulty, no peripheral edema  HEENT EOM in tact, no drainage    Assessment/Plan:   Principal Problem:   MRSA bacteremia Active Problems:   Septic arthritis of right [place] joint (HCC)   AKI (acute kidney injury) ([place])   Essential hypertension   Endocarditis of mitral valve  Patient is a 58 year old female initially presented with right shoulder pain and found to have SI joint erosion on CT scan was separately taken for I&D due to presence of MRSA bacteremia.  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  # MRSA bacteremia complicated by septic arthritis and native valve endocarditis: Source remains unclear, plan is for 6-week course of outpatient IV vancomycin with repeat echocardiogram in 3 to 4 weeks.  Final day of IV antibiotics tentatively set for December 06, 2018.  OPAT order placed, PICC line in place. * Patient's insurance card had expired complicating the requirement of a home health agency.  Patient has same insurance provider but you number.  Social work continue to work on finding placement.  Diet: Regular DVT Ppx: Lovenox Dispo: Anticipated discharge pending acquirement of home health agency.  Jeanmarie Hubert, MD 11/01/2018, 2:23 PM Pager:  [phone]

## 2018-11-01 NOTE — Consult Note (Signed)
[place] Nurse wound follow up Wound type:[place] joint abscess.  NPWT dressing change Measurement:2.6 cm x 5 cm x 1 cm wound has contracted nicely.   Wound bed: pink and moist  Drainage (amount, consistency, odor) moderate serosanguinous  363mL in canister Periwound:intact  0.2 cm ruptured blister beneath drape in fold of neck.  Did not cover with drape today to promote healing.  Dressing procedure/placement/frequency:1 piece black foam.  Seal immediately achieved.  [place] team will follow.  Domenic Moras MSN, RN, FNP-BC CWON Wound, Ostomy, Continence Nurse Pager [phone]

## 2018-11-01 NOTE — Progress Notes (Signed)
      [address]       [place] [address]             [phone]       7 Days Post-Op Procedure(s) (LRB): I&D Right sternoclavicular joint (Right) TRANSESOPHAGEAL ECHOCARDIOGRAM (TEE) (N/A)  Subjective: Patient sitting in chair. She states her upper thigh feels numb at times.  Objective: Vital signs in last 24 hours: Temp:  [98 F (36.7 C)-98.5 F (36.9 C)] 98 F (36.7 C) (09/09 0820) Pulse Rate:  [68-89] 87 (09/09 0820) Cardiac Rhythm: Heart block (09/08 1900) Resp:  [14-18] 18 (09/09 0820) BP: (110-158)/(65-99) 137/99 (09/09 0820) SpO2:  [97 %-100 %] 97 % (09/09 0820)      Intake/Output from previous day: 09/08 0701 - 09/09 0700 In: 840 [P.O.:840] Out: 5 [Urine:5]   Physical Exam:  Cardiovascular: RRR Pulmonary: Clear to auscultation bilaterally Wounds: Wound VAC in place. Drainage is thin like and sero sanguionous   Lab Results: CBC: Recent Labs    10/29/18 0933 10/30/18 0400  WBC 7.9 9.0  HGB 11.2* 10.6*  HCT 36.7 34.8*  PLT 333 308   BMET:  Recent Labs    10/29/18 0933 10/30/18 0400  NA 136 135  K 3.4* 3.6  CL 100 102  CO2 27 25  GLUCOSE 105* 107*  BUN 14 15  CREATININE 0.95 0.96  CALCIUM 9.2 8.8*    PT/INR: No results for input(s): LABPROT, INR in the last 72 hours. ABG:  INR: Will add last result for INR, ABG once components are confirmed Will add last 4 CBG results once components are confirmed  Assessment/Plan:  1. CV - SR. On HCTZ 25 mg daily and Irbesartan 300 mg daily 2.  Pulmonary - On room air. 3. ID-PICC line in place. 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 Regarding left thigh numbness-patient has tried walking more and re positioning. Possibly related to compression of a nerve-per primary 5. Disposition per primary service  Donielle M ZimmermanPA-C 11/01/2018,8:39 AM [phone]

## 2018-11-02 LAB — BASIC METABOLIC PANEL
Anion gap: 9 (ref 5–15)
BUN: 16 mg/dL (ref 6–20)
CO2: 25 mmol/L (ref 22–32)
Calcium: 9.2 mg/dL (ref 8.9–10.3)
Chloride: 103 mmol/L (ref 98–111)
Creatinine, Ser: 0.92 mg/dL (ref 0.44–1.00)
GFR calc Af Amer: >60 mL/min (ref >60)
GFR calc non Af Amer: >60 mL/min (ref >60)
Glucose, Bld: 100 mg/dL — ABNORMAL HIGH (ref 70–99)
Potassium: 3.7 mmol/L (ref 3.5–5.1)
Sodium: 137 mmol/L (ref 135–145)

## 2018-11-02 LAB — GLUCOSE, CAPILLARY
Glucose-Capillary: 95 mg/dL (ref 70–99)
Glucose-Capillary: 92 mg/dL (ref 70–99)
Glucose-Capillary: 97 mg/dL (ref 70–99)
Glucose-Capillary: 103 mg/dL — ABNORMAL HIGH (ref 70–99)

## 2018-11-02 NOTE — Plan of Care (Signed)

## 2018-11-02 NOTE — Progress Notes (Signed)
8 Days Post-Op Procedure(s) (LRB): I&D Right sternoclavicular joint (Right) TRANSESOPHAGEAL ECHOCARDIOGRAM (TEE) (N/A) Subjective: Sitting up eating breakfast. Says she is feeling better each day and is having less pain.  Range of motion in her right shoulder is improving and is less painful.   Objective: Vital signs in last 24 hours: Temp:  [98 F (36.7 C)-98.6 F (37 C)] 98.2 F (36.8 C) (09/10 0701) Pulse Rate:  [79-87] 79 (09/10 0701) Cardiac Rhythm: Normal sinus rhythm (09/09 1900) Resp:  [14-18] 14 (09/10 0701) BP: (104-137)/(67-99) 104/69 (09/10 0701) SpO2:  [97 %-98 %] 98 % (09/09 2059)    Intake/Output from previous day: 09/09 0701 - 09/10 0700 In: 1806.8 [P.O.:360; IV Piggyback:1446.8] Out: 1 [Urine:1] Intake/Output this shift: No intake/output data recorded.  General appearance: alert, cooperative and no distress Wound: Vac dressing is intact and appears to be functioning appriopriately. Minimal wound drainage. Surrounding tissues are healthy.   Lab Results: No results for input(s): WBC, HGB, HCT, PLT in the last 72 hours. BMET:  Recent Labs    11/01/18 1520 11/02/18 0325  NA 137 137  K 3.9 3.7  CL 102 103  CO2 24 25  GLUCOSE 96 100*  BUN 17 16  CREATININE 1.26* 0.92  CALCIUM 9.6 9.2    PT/INR: No results for input(s): LABPROT, INR in the last 72 hours. ABG    Component Value Date/Time   PHART 7.465 (H) 10/25/2018 0935   HCO3 28.1 (H) 10/25/2018 0935   O2SAT 89.7 10/25/2018 0935   CBG (last 3)  Recent Labs    11/01/18 1626 11/01/18 2155 11/02/18 0701  GLUCAP 73 77 95    Assessment/Plan: S/P Procedure(s) (LRB): I&D Right sternoclavicular joint (Right) TRANSESOPHAGEAL ECHOCARDIOGRAM (TEE) (N/A)  -POD-8 incision and drainage of right [place] joint abscess and wound vac placement. Continues to have improvement in pain and ROM of the right shoulder. Initial wound Cx and blood cx positive for MRSA. Continue Vanc per PICC. VAC change tomorrow.  Care  management team working on arranging outpatient wound care and ABX.  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 ID following. IV Vanc for 6 weeks planned.   -No change in management from thoracic surgery standpoint. We will continue to follow.    LOS: 9 days    Antony Odea, [place] [phone] 11/02/2018

## 2018-11-02 NOTE — Progress Notes (Signed)
  Subjective:  Patient seen on rounds this morning.  No chest pain or shortness of breath.  Patient reports continued discomfort of her left lateral thigh, denies new numbness or weakness.   Objective:    Vital Signs (last 24 hours): Vitals:   11/01/18 0820 11/01/18 2059 11/02/18 0701 11/02/18 0842  BP: (!) 137/99 134/67 104/69 123/81  Pulse: 87 82 79 95  Resp: 18 15 14 18   Temp: 98 F (36.7 C) 98.6 F (37 C) 98.2 F (36.8 C) 98.3 F (36.8 C)  TempSrc: Oral Oral Oral Oral  SpO2: 97% 98%  100%  Weight:      Height:         Physical Exam: General Alert and answers questions appropriately, no acute distress  HEENT Eyes without drainage, EOM in tact  Neuro Moves extremities without difficulty  Skin No rashes, lesions, bleeding    Assessment/Plan:   Principal Problem:   MRSA bacteremia Active Problems:   Septic arthritis of right [place] joint (HCC)   AKI (acute kidney injury) ([place])   Essential hypertension   Endocarditis of mitral valve  Patient is a 58 year old female who initially presented with right shoulder pain and found to have SI joint erosion on CT scan with subsequent taken for I&D due to presence of MRSA bacteremia.  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  # MRSA bacteremia complicated by septic arthritis native valve endocarditis: Source remains unclear, plan is for 6-week course of outpatient IV vancomycin with repeat echocardiogram in 3 to 4 weeks.  Final day of IV antibiotics tentatively set for December 06, 2018. OPAT order placed, PICC line in place. * Care management continues to work on finding home health agency. * Spoke with cardiothoracic surgery, states likely need wound vac for another 7-10 days  # Numbness of left lateral leg: Unchanged since day prior.  Numbness appears to be in distribution of lateral femoral cutaneous nerve.  No associated weakness or other deficits.  Initially concern for CVA is higher risk from  endocarditis.  MRI was negative for acute infarct. * Patient started on Robaxin yesterday, will monitor for improvement  Diet: Regular DVT Ppx: Lovenox Dispo: Anticipated discharge pending requirement of home health agency.  Jeanmarie Hubert, MD 11/02/2018, 12:24 PM Pager: [phone]

## 2018-11-03 LAB — BASIC METABOLIC PANEL
Anion gap: 8 (ref 5–15)
BUN: 13 mg/dL (ref 6–20)
CO2: 25 mmol/L (ref 22–32)
Calcium: 9.2 mg/dL (ref 8.9–10.3)
Chloride: 106 mmol/L (ref 98–111)
Creatinine, Ser: 0.92 mg/dL (ref 0.44–1.00)
GFR calc Af Amer: >60 mL/min (ref >60)
GFR calc non Af Amer: >60 mL/min (ref >60)
Glucose, Bld: 110 mg/dL — ABNORMAL HIGH (ref 70–99)
Potassium: 3.8 mmol/L (ref 3.5–5.1)
Sodium: 139 mmol/L (ref 135–145)

## 2018-11-03 LAB — GLUCOSE, CAPILLARY
Glucose-Capillary: 98 mg/dL (ref 70–99)
Glucose-Capillary: 92 mg/dL (ref 70–99)
Glucose-Capillary: 111 mg/dL — ABNORMAL HIGH (ref 70–99)
Glucose-Capillary: 104 mg/dL — ABNORMAL HIGH (ref 70–99)

## 2018-11-03 NOTE — Progress Notes (Signed)
9 Days Post-Op Procedure(s) (LRB): I&D Right sternoclavicular joint (Right) TRANSESOPHAGEAL ECHOCARDIOGRAM (TEE) (N/A) Subjective: Pt. out of bed this AM.  Says she feels well and is without complaint. She again reports improvement in right shoulder mobility.   Objective: Vital signs in last 24 hours: Temp:  [98.3 F (36.8 C)-98.4 F (36.9 C)] 98.4 F (36.9 C) (09/11 0838) Pulse Rate:  [83-88] 88 (09/11 0838) Cardiac Rhythm: Heart block (09/11 0700) Resp:  [18] 18 (09/10 1428) BP: (141-152)/(65-77) 152/77 (09/11 0838) SpO2:  [98 %-100 %] 98 % (09/11 0838)    Intake/Output from previous day: 09/10 0701 - 09/11 0700 In: 1760.6 [P.O.:720; IV Piggyback:1040.6] Out: 0  Intake/Output this shift: No intake/output data recorded.  Physical Exam: General appearance: alert, cooperative and no distress Wound: The right chest wound wa examed during Vac change at the bedside this AM. There is excellent granulation with no exudate or devitalized tissue. There was no significant undermining and overal it has contracted more than expected. Minimal wound drainage. Surrounding tissues remain healthy.    Lab Results: No results for input(s): WBC, HGB, HCT, PLT in the last 72 hours. BMET:  Recent Labs    11/02/18 0325 11/03/18 0353  NA 137 139  K 3.7 3.8  CL 103 106  CO2 25 25  GLUCOSE 100* 110*  BUN 16 13  CREATININE 0.92 0.92  CALCIUM 9.2 9.2    PT/INR: No results for input(s): LABPROT, INR in the last 72 hours. ABG    Component Value Date/Time   PHART 7.465 (H) 10/25/2018 0935   HCO3 28.1 (H) 10/25/2018 0935   O2SAT 89.7 10/25/2018 0935   CBG (last 3)  Recent Labs    11/02/18 1710 11/02/18 2106 11/03/18 0650  GLUCAP 97 103* 98    Assessment/Plan: S/P Procedure(s) (LRB): I&D Right sternoclavicular joint (Right) TRANSESOPHAGEAL ECHOCARDIOGRAM (TEE) (N/A)  -POD-9 incision and drainage of right [place] joint abscess and wound vac placement. Continues to have improvement in  pain and ROM of the right shoulder. Initial wound Cx and blood cx positive for MRSA. Continue Vanc per PICC. So far, the care management team has not been able to get a commitment from a home health agenct to  assume outpatient wound care and ABX. Plan to continue Vac therapy over the weekend but if current progress continues, we may be able to safely convert to BID moist to dry dressings by Monday.   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 ID following. IV Vanc for 6 weeks planned. F/U echo in 3-4 weeks.  -No change in management from thoracic surgery standpoint. We will continue to follow.   LOS: 10 days    Antony Odea, [place] [phone] 11/03/2018

## 2018-11-03 NOTE — Plan of Care (Signed)

## 2018-11-03 NOTE — TOC Progression Note (Signed)
Transition of Care [facility]) - Progression Note    Patient Details  Name: Tiffany Bullock MRN: [id number] Date of Birth: Jun 14, 1960  Transition of Care [facility]) CM/SW [place] RN, BSN, NCM-BC, [place] [phone] Phone Number: 11/03/2018, 1:14 PM  Clinical Narrative:    CM still following for transitional needs. CM spoke to the patient on 11/02/18 to discuss possibly transitioning to a ST SNF for LT IV ABT and wound vac care; patient declined, stating she was informed by the WOCN of only needing the wound vac for a few more days. Patient will be able to just transition home with ABT provided by [facility], with caregiver support for administration. [facility] will contract out to [facility] for IV/PICC care. CM team will continue to follow.    Expected Discharge Plan: [place] Barriers to Discharge: No SNF bed, Continued Medical Work up  Expected Discharge Plan and Services Expected Discharge Plan: [place] Choice: Durable Medical Equipment, Home Health Living arrangements for the past 2 months: Single Family Home Expected Discharge Date: 10/30/18                         HH Arranged: RN HH Agency: [facility] Date HH Agency Contacted: 10/27/18 Time [place]: 1054 Representative spoke with at [place]: Contacted diretly through [facility] - pt is in agreement with utilizing company   Social Determinants of Health (SDOH) Interventions    Readmission Risk Interventions No flowsheet data found.

## 2018-11-03 NOTE — Consult Note (Signed)
[place] Nurse wound follow up  Wound type:[place] joint abscess.  NPWT dressing change Measurement:1 cm x 5 cm x 1 cm wound continues to contract. Anticipate converting to topical dressings on next VAC change.    Wound bed: pink and moist  Drainage (amount, consistency, odor) minimal serosanguinous   Periwound:intact  0.2 cm ruptured blister beneath drape in fold of neck, resolved  procedure/placement/frequency:1 piece black foam.  Seal immediately achieved.  [place] team will follow.  Domenic Moras MSN, RN, FNP-BC CWON Wound, Ostomy, Continence Nurse Pager [phone]

## 2018-11-03 NOTE — Progress Notes (Signed)
  Subjective:  Patient reports she is doing alright this morning. Denies fever, chills, shortness of breath. Reports continued numbness/discomfort over left lateral thigh but states that it has improved. Patient is hopeful to be discharged on Monday.   Objective:    Vital Signs (last 24 hours): Vitals:   11/02/18 0842 11/02/18 1428 11/02/18 2040 11/03/18 0453  BP: 123/81 (!) 141/65 (!) 146/68 (!) 141/67  Pulse: 95 85 85 83  Resp: 18 18    Temp: 98.3 F (36.8 C) 98.3 F (36.8 C) 98.3 F (36.8 C) 98.3 F (36.8 C)  TempSrc: Oral Oral Oral Oral  SpO2: 100% 100% 98% 99%  Weight:      Height:        Physical Exam: General Alert and answers questions appropriately, no acute distress  HEENT Eyes without drainage, EOM in tact  Pulmonary Normal work of breathing  Skin No rashes, lesions, bleeding    Assessment/Plan:   Principal Problem:   MRSA bacteremia Active Problems:   Septic arthritis of right [place] joint (HCC)   AKI (acute kidney injury) ([place])   Essential hypertension   Endocarditis of mitral valve  Patient is a 58 year old female initially presented with right shoulder pain and found to have SI joint erosion on CT scan and subsequently taken for I&D due to presence of MRSA bacteremia.  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  # MRSA bacteremia complicated by septic arthritis and native valve endocarditis:  Source remains unclear, plan is for 6-week course of outpatient IV vancomycin with repeat echocardiogram in 3 to 4 weeks.  Final day of IV antibiotics [place] after December 06, 2018. OPAT order placed, PICC line in place. * Care management continues to work on finding home health agency * Per cardiothoracic surgery, wound vac to be continued over weekend and may be able to switch to moist to dry dressings on Monday.  # Numbness of left lateral leg: With improvement today. Numbness in distribution of lateral femoral cutaneous nerve. No associated  weakness or other deficits. MRI negative for acute infarct. * Continuing patient on robaxin  Diet: Regular DVT Ppx: Lovenox Dispo: Anticipated discharge pending requirement of home health agency.   Jeanmarie Hubert, MD 11/03/2018, 7:34 AM Pager: [phone]

## 2018-11-04 LAB — CBC
HCT: 35.1 % — ABNORMAL LOW (ref 36.0–46.0)
Hemoglobin: 10.7 g/dL — ABNORMAL LOW (ref 12.0–15.0)
MCH: 26 pg (ref 26.0–34.0)
MCHC: 30.5 g/dL (ref 30.0–36.0)
MCV: 85.2 fL (ref 80.0–100.0)
Platelets: 308 10*3/uL (ref 150–400)
RBC: 4.12 MIL/uL (ref 3.87–5.11)
RDW: 14.9 % (ref 11.5–15.5)
WBC: 7.8 10*3/uL (ref 4.0–10.5)
nRBC: 0 % (ref 0.0–0.2)

## 2018-11-04 LAB — BASIC METABOLIC PANEL
Anion gap: 8 (ref 5–15)
BUN: 15 mg/dL (ref 6–20)
CO2: 25 mmol/L (ref 22–32)
Calcium: 9.2 mg/dL (ref 8.9–10.3)
Chloride: 105 mmol/L (ref 98–111)
Creatinine, Ser: 1.06 mg/dL — ABNORMAL HIGH (ref 0.44–1.00)
GFR calc Af Amer: >60 mL/min (ref >60)
GFR calc non Af Amer: 58 mL/min — ABNORMAL LOW (ref >60)
Glucose, Bld: 105 mg/dL — ABNORMAL HIGH (ref 70–99)
Potassium: 3.7 mmol/L (ref 3.5–5.1)
Sodium: 138 mmol/L (ref 135–145)

## 2018-11-04 LAB — VANCOMYCIN, PEAK: Vancomycin Pk: 35 ug/mL (ref 30–40)

## 2018-11-04 LAB — GLUCOSE, CAPILLARY
Glucose-Capillary: 97 mg/dL (ref 70–99)
Glucose-Capillary: 79 mg/dL (ref 70–99)
Glucose-Capillary: 124 mg/dL — ABNORMAL HIGH (ref 70–99)
Glucose-Capillary: 106 mg/dL — ABNORMAL HIGH (ref 70–99)

## 2018-11-04 NOTE — Progress Notes (Signed)
Pt eating dinner and daughter in the room with the pt.

## 2018-11-04 NOTE — Progress Notes (Addendum)
      [address]       [place] [address]             [phone]       10 Days Post-Op Procedure(s) (LRB): I&D Right sternoclavicular joint (Right) TRANSESOPHAGEAL ECHOCARDIOGRAM (TEE) (N/A)  Subjective: Patient about to wash up. She is in good sprits this am.  Objective: Vital signs in last 24 hours: Temp:  [97.4 F (36.3 C)-98.2 F (36.8 C)] 98.2 F (36.8 C) (09/12 0747) Pulse Rate:  [78-92] 78 (09/12 0747) Cardiac Rhythm: Heart block (09/12 0700) Resp:  [15-18] 15 (09/12 0747) BP: (119-158)/(60-72) 158/72 (09/12 0747) SpO2:  [98 %-100 %] 98 % (09/12 0747)      Intake/Output from previous day: 09/11 0701 - 09/12 0700 In: 1040 [P.O.:480; IV Piggyback:560] Out: 0    Physical Exam:  Cardiovascular: RRR Pulmonary: Clear to auscultation bilaterally Wounds: Wound VAC in place. Drainage issero sanguionous   Lab Results: CBC: Recent Labs    11/04/18 0449  WBC 7.8  HGB 10.7*  HCT 35.1*  PLT 308   BMET:  Recent Labs    11/03/18 0353 11/04/18 0449  NA 139 138  K 3.8 3.7  CL 106 105  CO2 25 25  GLUCOSE 110* 105*  BUN 13 15  CREATININE 0.92 1.06*  CALCIUM 9.2 9.2    PT/INR: No results for input(s): LABPROT, INR in the last 72 hours. ABG:  INR: Will add last result for INR, ABG once components are confirmed Will add last 4 CBG results once components are confirmed  Assessment/Plan:  1. CV - SR. On HCTZ 25 mg daily and Irbesartan 300 mg daily 2.  Pulmonary - On room air. 3. ID-PICC line in place. 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 Wound VAC to be changed Monday;hopefully, there will be enough granulation that wet to dry dressings can be applied and wound VAC will be removed.  Donielle M ZimmermanPA-C 11/04/2018,9:01 AM [id number]   Chart reviewed, patient examined, agree with above.

## 2018-11-04 NOTE — Progress Notes (Signed)
Phlebotomy at the bedside. States that patient's Vanc trough is due at 1930.   Patient has a PICC line.   Phlebotomy requests patient's RN to page IV team RN for assistance with drawing labs.  Patient's RN placed an order for PICC draw of Vanc trough due now.   Nursing will continue to monitor.

## 2018-11-04 NOTE — Progress Notes (Signed)
Pt alert and oriented x4, no complaints of pain or discomfort.  Bed in low position, call bell within reach.  Bed alarms on and functioning.  Assessment done and charted.  Will continue to monitor and do hourly rounding throughout the shift 

## 2018-11-04 NOTE — Progress Notes (Signed)
   Subjective: Tiffany Bullock is feeling better today.  She continues to have improvement in her right neck/shoulder pain.  She is antsy to be discharged home but understands that she will need continued wound care as her insurance not cover this at discharge she will need to remain hospitalized.  She is in agreement with this.  Objective:  Vital signs in last 24 hours: Vitals:   11/03/18 1332 11/03/18 1917 11/04/18 0336 11/04/18 0747  BP: 132/69 119/60 119/66 (!) 158/72  Pulse: 92 86 81 78  Resp: 18 17 16 15   Temp: (!) 97.4 F (36.3 C) 98 F (36.7 C) 97.9 F (36.6 C) 98.2 F (36.8 C)  TempSrc: Oral Oral Oral Oral  SpO2: 100% 100% 100% 98%  Weight:      Height:       General: A/O x4, in no acute distress, afebrile, nondiaphoretic HEENT: PEERL, EMO intact MSK: BLE nontender, nonedematous Neuro: Conversational, normal gait Psych: Appropriate affect, not depressed in appearance, engages well  Assessment/Plan:  Principal Problem:   MRSA bacteremia Active Problems:   Septic arthritis of right [place] joint (HCC)   AKI (acute kidney injury) ([place])   Essential hypertension   Endocarditis of mitral valve   Patient is a 58 year old female presented with right shoulder pain found to have [place] joint erosion on CT scan subsequently taken for I&D by [facility] due to presence of MRSA bacteremia in the setting of their erosive joint.  She has improved remarkably following the I&D and wound VAC placement. We will continue vancomycin IV for likely mitral and tricuspid valve endocarditis demonstrated on TEE.  Unfortunately, due to insurance complications home health care was unable to be established and patient has remained hospitalized to complete her wound care therapy.  A/P: MRSA bacteremia, given septic arthritis and native valve endocarditis: ID on board.  Patient will need to complete a 6-week course of antibiotics and repeat echocardiogram in 3 to 4 weeks.  Final day of IV antibiotics is  anticipated at December 06, 2018.  She will need to follow-up with ID prior to discontinuation.  All outpatient orders completed.  Awaiting wound care home health arrangements versus recovery for discharge disposition. Patient will likely remain admitted untill Monday when she can begin self treatment of her wound with wet-to-dry bandages. -Continue IV vancomycin  -Continue PICC line care -Patient will need to follow with ID clinic for further assistance and monitoring -[facility] will contract for IV pick care and antibiotics  Diet: Regular DVT prophylaxis: Lovenox Code: Full Dispo: Anticipated discharge in approximately 2-3 day(s).   Kathi Ludwig, MD 11/04/2018, 8:30 AM Pager: # [phone]

## 2018-11-04 NOTE — Plan of Care (Signed)
  Problem: Pain Managment: Goal: General experience of comfort will improve Outcome: Progressing   

## 2018-11-04 NOTE — Progress Notes (Signed)
Pharmacy Antibiotic Note  Tiffany Bullock is a 58 y.o. female admitted on 10/23/2018 with Septic arthritis of the [place] joint with MRSA bacteremia and endocarditis.  Pharmacy has been consulted for Vancomyicn dosing. Patient wbc down to 7.8, afebrile, Scr slight bump to 1.06 from 0.92, vanc planned for 6 weeks until 12/06/18.  Plan: Continue Vancomycin 1750mg  IV every 24 hours Peak today @ 1930, trough tomorrow @ 1500   Height: 5\' 7"  (170.2 cm) Weight: (!) 381 lb 6.3 oz (173 kg) IBW/kg (Calculated) : 61.6  Temp (24hrs), Avg:97.9 F (36.6 C), Min:97.4 F (36.3 C), Max:98.2 F (36.8 C)  Recent Labs  Lab 10/28/18 1437  10/29/18 0933 10/30/18 0400 10/31/18 2000 11/01/18 1520 11/02/18 0325 11/03/18 0353 11/04/18 0449  WBC  --   --  7.9 9.0  --   --   --   --  7.8  CREATININE  --    < > 0.95 0.96  --  1.26* 0.92 0.92 1.06*  VANCOTROUGH 9*  --   --   --   --  14*  --   --   --   VANCOPEAK  --   --   --   --  46*  --   --   --   --    < > = values in this interval not displayed.    Estimated Creatinine Clearance: 98.2 mL/min (A) (by C-G formula based on SCr of 1.06 mg/dL (H)).    No Known Allergies  Antimicrobials this admission: Vancomycin 9/1 >> (until 12/06/18)  Dose adjustments this admission: Intially: Vancomycin 2000 mg IV Q 24 hrs. Goal AUC 400-550. Expected AUC: 473.1, SCr used: 1.14 - 9/3: recalculated Vancomycin 2500mg  IV q 24h (AUC 456 with Scr 0.86) - 9/5: recalculated Vanc 2250mg  IV q24h (AUC ~520 with SCr 0.99 using empiric calculations)             - if dosing vanc from 2 levels: VP 38, VT 9 (but not technically at steady state as only 2 doses of 2500 mg given since dose change 9/3) estimated AUC 551 with SCr 0.99  - 9/9: VP 46 on 9/8, VT 14 on 9/9 - on 2250 q24h > AUC 652 > chg'd to 1750 IV q24h for AUC 507.  Microbiology results: 9/1 Blood x 2: MRSA 9/1 BCID: MRSA 9/2 MRSA PCR: negative 9/2 sternoclavicular joint fluid: Rare MRSA 9/3 Blood x 2:  negative 9/1 COVID: negative  Thank you for allowing pharmacy to be a part of this patient's care.  Abelardo Diesel  11/04/2018 9:30 AM

## 2018-11-04 NOTE — Progress Notes (Signed)
Pt walking in the halls, tolerating it well

## 2018-11-05 LAB — BASIC METABOLIC PANEL
Anion gap: 8 (ref 5–15)
BUN: 14 mg/dL (ref 6–20)
CO2: 25 mmol/L (ref 22–32)
Calcium: 9.3 mg/dL (ref 8.9–10.3)
Chloride: 104 mmol/L (ref 98–111)
Creatinine, Ser: 0.95 mg/dL (ref 0.44–1.00)
GFR calc Af Amer: >60 mL/min (ref >60)
GFR calc non Af Amer: >60 mL/min (ref >60)
Glucose, Bld: 108 mg/dL — ABNORMAL HIGH (ref 70–99)
Potassium: 3.5 mmol/L (ref 3.5–5.1)
Sodium: 137 mmol/L (ref 135–145)

## 2018-11-05 LAB — GLUCOSE, CAPILLARY
Glucose-Capillary: 87 mg/dL (ref 70–99)
Glucose-Capillary: 163 mg/dL — ABNORMAL HIGH (ref 70–99)
Glucose-Capillary: 81 mg/dL (ref 70–99)
Glucose-Capillary: 94 mg/dL (ref 70–99)

## 2018-11-05 LAB — VANCOMYCIN, TROUGH: Vancomycin Tr: 10 ug/mL — ABNORMAL LOW (ref 15–20)

## 2018-11-05 NOTE — Progress Notes (Signed)
RN offered pain medication during rounds. Patient declined. Nursing will continue to monitor.

## 2018-11-05 NOTE — Progress Notes (Signed)
Pharmacy Antibiotic Note  Tiffany Bullock is a 58 y.o. female admitted on 10/23/2018 with Septic arthritis of the  joint with MRSA bacteremia and endocarditis.  Pharmacy has been consulted for Vancomyicn dosing. Vanc planned for 6 weeks until 12/06/18. - Vancomycin peak= 35 (9/12 at 2038); Vanc trough= 10 (9/13 at 1446); AUC= 484 -SCr= 0.95  Plan: -Continue Vancomycin 1750mg  IV every 24 hours -Will follow renal function, cultures and clinical progress    Height: 5\' 7"  (170.2 cm) Weight: (!) 381 lb 6.3 oz (173 kg) IBW/kg (Calculated) : 61.6  Temp (24hrs), Avg:98 F (36.7 C), Min:97.9 F (36.6 C), Max:98.3 F (36.8 C)  Recent Labs  Lab 10/30/18 0400 10/31/18 2000 11/01/18 1520 11/02/18 0325 11/03/18 0353 11/04/18 0449 11/04/18 2038 11/05/18 0843 11/05/18 1446  WBC 9.0  --   --   --   --  7.8  --   --   --   CREATININE 0.96  --  1.26* 0.92 0.92 1.06*  --  0.95  --   VANCOTROUGH  --   --  14*  --   --   --   --   --  10*  VANCOPEAK  --  46*  --   --   --   --  35  --   --     Estimated Creatinine Clearance: 109.5 mL/min (by C-G formula based on SCr of 0.95 mg/dL).    No Known Allergies  Antimicrobials this admission: Vancomycin 9/1 >> (until 12/06/18)  Dose adjustments this admission: Intially: Vancomycin 2000 mg IV Q 24 hrs. Goal AUC 400-550. Expected AUC: 473.1, SCr used: 1.14 - 9/3: recalculated Vancomycin 2500mg  IV q 24h (AUC 456 with Scr 0.86) - 9/5: recalculated Vanc 2250mg  IV q24h (AUC ~520 with SCr 0.99 using empiric calculations)             - if dosing vanc from 2 levels: VP 38, VT 9 (but not technically at steady state as only 2 doses of 2500 mg given since dose change 9/3) estimated AUC 551 with SCr 0.99  - 9/9: VP 46 on 9/8, VT 14 on 9/9 - on 2250 q24h > AUC 652 > chg'd to 1750 IV q24h for AUC 507. 9/12: dose 9/12 at 1712; VP= 35 (9/12 at 2038); VT= 10 (9/13 at 1446); AUC= 484; continue 1750 IV q24h    Microbiology results: 9/1 Blood x 2: MRSA 9/1  BCID: MRSA 9/2 MRSA PCR: negative 9/2 sternoclavicular joint fluid: Rare MRSA 9/3 Blood x 2: negative 9/1 COVID: negative  Thank you for allowing pharmacy to be a part of this patient's care.  Hildred Laser, PharmD Clinical Pharmacist **Pharmacist phone directory can now be found on [email or web address] ([email or web address]).  Listed under [place].

## 2018-11-05 NOTE — Plan of Care (Signed)
  Problem: Pain Managment: Goal: General experience of comfort will improve Outcome: Progressing   

## 2018-11-05 NOTE — Plan of Care (Signed)

## 2018-11-05 NOTE — Progress Notes (Signed)
   Subjective: Patient stated that she was feeling well overall.  Denied acute complaints.  Pain in her neck and shoulder has improved dramatically.  She is anxious to get home but understands that she will need to have the wound VAC is approved through insurance or discontinued prior to discharge.  She is agreeable with this plan.  Objective:  Vital signs in last 24 hours: Vitals:   11/04/18 1919 11/04/18 2011 11/05/18 0335 11/05/18 0955  BP: 137/82 125/67 124/80 (!) 104/92  Pulse: 92 87 81 70  Resp: 18 17 16 17   Temp: 97.9 F (36.6 C) 98.3 F (36.8 C) 97.9 F (36.6 C) 98 F (36.7 C)  TempSrc: Oral Oral Oral Oral  SpO2: 97% 94% 100% 100%  Weight:      Height:       General: A/O x4, in no acute distress, afebrile, nondiaphoretic HEENT: PEERL, EMO intact Cardio: RRR, no mrg's  Pulmonary: CTA bilaterally, no wheezing or crackles  Abdomen: Bowel sounds normal, soft, nontender  MSK: Right [place] joint has a wound VAC adhesive applied there is no tenderness to palpation Psych: Appropriate affect, not depressed in appearance, engages well  Assessment/Plan:  Principal Problem:   MRSA bacteremia Active Problems:   Septic arthritis of right [place] joint (HCC)   AKI (acute kidney injury) ([place])   Essential hypertension   Endocarditis of mitral valve   Patient is a 58 year old female presented with right shoulder pain found to have [place] joint erosion on CT scan subsequently taken for I&D by [facility] due to presence of MRSA bacteremia in the setting of their erosive joint.  She has improved remarkably following the I&D and wound VAC placement. We will continue vancomycin IV for likely mitral and tricuspid valve endocarditis demonstrated on TEE.  Unfortunately, due to insurance complications home health care was unable to be established and patient has remained hospitalized to complete her wound care therapy.  A/P: MRSA bacteremia, given septic arthritis and native valve endocarditis: The patient  continues to be stable.  She denies acute complaints.  BP this a.m. with normal renal function on exam creatinine of 0.95.  Pharmacy following for IV vancomycin management. -Continue IV vancomycin  -Continue PICC line care -Patient will need to follow with ID clinic for further assistance and monitoring -[facility] will contract for IV pick care and antibiotics  Diet: Regular DVT prophylaxis: Lovenox Code: Full Dispo: Anticipated discharge in approximately 1-2 day(s).   Kathi Ludwig, MD 11/05/2018, 12:17 PM Pager: # [phone]

## 2018-11-05 NOTE — Progress Notes (Addendum)
      [address]       [place] [address]             [phone]       11 Days Post-Op Procedure(s) (LRB): I&D Right sternoclavicular joint (Right) TRANSESOPHAGEAL ECHOCARDIOGRAM (TEE) (N/A)  Subjective: Patient already washed up. She hopes she can go home tomorrow.  Objective: Vital signs in last 24 hours: Temp:  [97.9 F (36.6 C)-98.3 F (36.8 C)] 97.9 F (36.6 C) (09/13 0335) Pulse Rate:  [81-96] 81 (09/13 0335) Cardiac Rhythm: Heart block (09/13 0700) Resp:  [14-18] 16 (09/13 0335) BP: (97-137)/(61-85) 124/80 (09/13 0335) SpO2:  [94 %-100 %] 100 % (09/13 0335)      Intake/Output from previous day: 09/12 0701 - 09/13 0700 In: 714 [P.O.:714] Out: -    Physical Exam:  Cardiovascular: RRR Pulmonary: Clear to auscultation bilaterally Wounds: Wound VAC in place. Drainage issero sanguionous   Lab Results: CBC: Recent Labs    11/04/18 0449  WBC 7.8  HGB 10.7*  HCT 35.1*  PLT 308   BMET:  Recent Labs    11/03/18 0353 11/04/18 0449  NA 139 138  K 3.8 3.7  CL 106 105  CO2 25 25  GLUCOSE 110* 105*  BUN 13 15  CREATININE 0.92 1.06*  CALCIUM 9.2 9.2    PT/INR: No results for input(s): LABPROT, INR in the last 72 hours. ABG:  INR: Will add last result for INR, ABG once components are confirmed Will add last 4 CBG results once components are confirmed  Assessment/Plan:  1. CV - SR. On HCTZ 25 mg daily and Irbesartan 300 mg daily 2.  Pulmonary - On room air. 3. ID-PICC line in place. 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 Wound VAC to be changed Monday;hopefully, there will be enough granulation that wet to dry dressings can be applied and wound VAC will be removed as unable to find [facility] agency that insurance will approve  Sharalyn Ink [facility] 11/05/2018,9:23 AM [id number]   Chart reviewed, patient examined, agree with above. Possibly change to wet/dry dressing  tomorrow.

## 2018-11-06 LAB — GLUCOSE, CAPILLARY
Glucose-Capillary: 85 mg/dL (ref 70–99)
Glucose-Capillary: 127 mg/dL — ABNORMAL HIGH (ref 70–99)
Glucose-Capillary: 90 mg/dL (ref 70–99)

## 2018-11-06 MED ORDER — VANCOMYCIN IV (FOR PTA / DISCHARGE USE ONLY): 1750.0000 mg | INTRAVENOUS | 0 refills | Status: AC

## 2018-11-06 NOTE — Progress Notes (Signed)
Pt and dtr taught how to complete wound care. Both verbalized understanding.  11/06/18 1750  AVS Discharge Documentation  AVS Discharge Instructions Including Medications Provided to patient/caregiver  Name of Person Receiving AVS Discharge Instructions Including Medications Pamala Hurry  Name of Clinician That Reviewed AVS Discharge Instructions Including Medications Dineen Kid, RN

## 2018-11-06 NOTE — Discharge Instructions (Signed)
Wound VAC to be changed Monday/Wednesday/Friday  Please make certain to follow up with the infectious diease clinic and the internal medicine clinic. It is essential that this is done to make certain you are well taken care of.    PICC Home Care Guide  A peripherally inserted central catheter (PICC) is a form of IV access that allows medicines and IV fluids to be quickly distributed throughout the body. The PICC is a long, thin, flexible tube (catheter) that is inserted into a vein in the upper arm. The catheter ends in a large vein in the chest (superior vena cava, or SVC). After the PICC is inserted, a chest X-ray may be done to make sure that it is in the correct place. A PICC may be placed for different reasons, such as:  To give medicines and liquid nutrition.  To give IV fluids and blood products.  If there is trouble placing a peripheral intravenous (PIV) catheter. If taken care of properly, a PICC can remain in place for several months. Having a PICC can also allow a person to go home from the hospital sooner. Medicine and PICC care can be managed at home by a family member, caregiver, or home health care team. What are the risks? Generally, having a PICC is safe. However, problems may occur, including:  A blood clot (thrombus) forming in or at the tip of the PICC.  A blood clot forming in a vein (deep vein thrombosis) or traveling to the lung (pulmonary embolism).  Inflammation of the vein (phlebitis) in which the PICC is placed.  Infection. Central line associated blood stream infection (CLABSI) is a serious infection that often requires hospitalization.  PICC movement (malposition). The PICC tip may move from its original position due to excessive physical activity, forceful coughing, sneezing, or vomiting.  A break or cut in the PICC. It is important not to use scissors near the PICC.  Nerve or tendon irritation or injury during PICC insertion. How to take care of your  PICC Preventing problems  You and any caregivers should wash your hands often with soap. Wash hands: ? Before touching the PICC line or the infusion device. ? Before changing a bandage (dressing).  Flush the PICC as told by your health care provider. Let your health care provider know right away if the PICC is hard to flush or does not flush. Do not use force to flush the PICC.  Do not use a syringe that is less than 10 mL to flush the PICC.  Avoid blood pressure checks on the arm in which the PICC is placed.  Never pull or tug on the PICC.  Do not take the PICC out yourself. Only a trained clinical professional should remove the PICC.  Use clean and sterile supplies only. Keep the supplies in a dry place. Do not reuse needles, syringes, or any other supplies. Doing that can lead to infection.  Keep pets and children away from your PICC line.  Check the PICC insertion site every day for signs of infection. Check for: ? Leakage. ? Redness, swelling, or pain. ? Fluid or blood. ? Warmth. ? Pus or a bad smell. PICC dressing care  Keep your PICC bandage (dressing) clean and dry to prevent infection.  Do not take baths, swim, or use a hot tub until your health care provider approves. Ask your health care provider if you can take showers. You may only be allowed to take sponge baths for bathing. When you are allowed  to shower: ? Ask your health care provider to teach you how to wrap the PICC line. ? Cover the PICC line with clear plastic wrap and tape to keep it dry while showering.  Follow instructions from your health care provider about how to take care of your insertion site and dressing. Make sure you: ? Wash your hands with soap and water before you change your bandage (dressing). If soap and water are not available, use hand sanitizer. ? Change your dressing as told by your health care provider. ? Leave stitches (sutures), skin glue, or adhesive strips in place. These skin  closures may need to stay in place for 2 weeks or longer. If adhesive strip edges start to loosen and curl up, you may trim the loose edges. Do not remove adhesive strips completely unless your health care provider tells you to do that.  Change your PICC dressing if it becomes loose or wet. General instructions   Carry your PICC identification card or wear a medical alert bracelet at all times.  Keep the tube clamped at all times, unless it is being used.  Carry a smooth-edge clamp with you at all times to place on the tube if it breaks.  Do not use scissors or sharp objects near the tube.  You may bend your arm and move it freely. If your PICC is near or at the bend of your elbow, avoid activity with repeated motion at the elbow.  Avoid lifting heavy objects as told by your health care provider.  Keep all follow-up visits as told by your health care provider. This is important. Disposal of supplies  Throw away any syringes in a disposal container that is meant for sharp items (sharps container). You can buy a sharps container from a pharmacy, or you can make one by using an empty hard plastic bottle with a cover.  Place any used dressings or infusion bags into a plastic bag. Throw that bag in the trash. Contact a health care provider if:  You have pain in your arm, ear, face, or teeth.  You have a fever or chills.  You have redness, swelling, or pain around the insertion site.  You have fluid or blood coming from the insertion site.  Your insertion site feels warm to the touch.  You have pus or a bad smell coming from the insertion site.  Your skin feels hard and raised around the insertion site. Get help right away if:  Your PICC is accidentally pulled all the way out. If this happens, cover the insertion site with a bandage or gauze dressing. Do not throw the PICC away. Your health care provider will need to check it.  Your PICC was tugged or pulled and has partially  come out. Do not  push the PICC back in.  You cannot flush the PICC, it is hard to flush, or the PICC leaks around the insertion site when it is flushed.  You hear a "flushing" sound when the PICC is flushed.  You feel your heart racing or skipping beats.  There is a hole or tear in the PICC.  You have swelling in the arm in which the PICC was inserted.  You have a red streak going up your arm from where the PICC was inserted. Summary  A peripherally inserted central catheter (PICC) is a long, thin, flexible tube (catheter) that is inserted into a vein in the upper arm.  The PICC is inserted using a sterile technique by a  specially trained nurse or physician. Only a trained clinical professional should remove it.  Keep your PICC identification card with you at all times.  Avoid blood pressure checks on the arm in which the PICC is placed.  If cared for properly, a PICC can remain in place for several months. Having a PICC can also allow a person to go home from the hospital sooner. This information is not intended to replace advice given to you by your health care provider. Make sure you discuss any questions you have with your health care provider. Document Released: 08/15/2002 Document Revised: 01/21/2017 Document Reviewed: 03/13/2016 Elsevier Patient Education  2020 [place].  Negative Pressure Wound Therapy Home Guide Negative pressure wound therapy (NPWT) uses a sponge or foam-like material (dressing) placed on or inside the wound. The wound is then covered and sealed with a cover dressing that sticks to your skin (is adhesive). This keeps air out. A tube is attached to the cover dressing, and this tube connects to a small pump. The pump sucks fluid and germs from the wound. NPWT helps to increase blood flow to the wound and heal it from the inside. What are the risks? NPWT is usually safe to use. However, problems can occur, including:  Skin irritation from the dressing  adhesive.  Bleeding.  Infection.  Dehydration. Wounds with large amounts of drainage can cause excessive fluid loss.  Pain. Supplies needed:  A disposable garbage bag.  Soap and water, or hand sanitizer.  Wound cleanser or salt-water solution (saline).  New sponge and cover dressing.  Protective clothing.  Gauze pad.  Vinyl gloves.  Tape.  Skin protectant. This may be a wipe, film, or spray.  Clean or germ-free (sterile) scissors.  Eye protection. How to change your dressing Prepare to change your dressing  1. If told by your health care provider, take pain medicine 30 minutes before changing the dressing. 2. Wash your hands with soap and water. Dry your hands with a clean towel. If soap and water are not available, use hand sanitizer. 3. Set up a clean station for wound care. 4. Open the dressing package so that the sponge dressing remains on the inside of the package. 5. Wear gloves, protective clothing, and eye protection. Remove old dressing  1. Turn off the pump and disconnect the tubing from the dressing. 2. Carefully remove the adhesive cover dressing in the direction of your hair growth. 3. Remove the sponge dressing that is inside the wound. If the sponge sticks, use a wound cleanser or saline solution to wet the sponge and help it come off more easily. 4. Throw the old sponge and cover dressing supplies into the garbage bag. 5. Remove your gloves by grabbing the cuff and turning the glove inside out. Place the gloves in the trash immediately. 6. Wash your hands with soap and water. Dry your hands with a clean towel. If soap and water are not available, use hand sanitizer. Clean your wound  Wear gloves, protective clothing, and eye protection. Follow your health care provider's instructions on how to clean your wound. You may be told to: 1. Clean the wound using a saline solution or a wound cleanser and a clean gauze pad. 2. Pat the wound dry with a gauze  pad. Do not rub the wound. 3. Throw the gauze pad into the garbage bag. 4. Remove your gloves by grabbing the cuff and turning the glove inside out. Place the gloves in the trash immediately. 5. Wash your  hands with soap and water. Dry your hands with a clean towel. If soap and water are not available, use hand sanitizer. Apply new dressing  Wear gloves, protective clothing, and eye protection. 1. If told by your health care provider, apply a skin protectant to any skin that will be exposed to adhesive. Let the skin protectant dry. 2. Cut a piece of new sponge dressing and put it on or in the wound. 3. Using clean scissors, cut a nickel-sized hole in the new cover dressing. 4. Apply the cover dressing. 5. Attach the suction tube over the hole in the cover dressing. 6. Take off your gloves. Put them in the plastic bag with the old dressing. Tie the bag shut and throw it away. 7. Wash your hands with soap and water. Dry your hands with a clean towel. If soap and water are not available, use hand sanitizer. 8. Turn the pump back on. The sponge dressing should collapse. Do not change the settings on the machine without talking to a health care provider. 9. Replace the container in the pump that collects fluid if it is full. Replace the container per the manufacturer's instructions or at least once a week, even if it is not full. General tips and recommendations If the alarm sounds:  Stay calm.  Do not turn off the pump or do anything with the dressing.  Reasons the alarm may go off: ? The battery is low. Change the battery or plug the device into electrical power. ? The dressing has a leak. Find the leak and put tape over the leak. ? The fluid collection container is full. Change the fluid container.  Call your health care provider right away if you cannot fix the problem.  Explain to your health care provider what is happening. Follow his or her instructions. General instructions  Do  not turn off the pump unless told to do so by your health care provider.  Do not turn off the pump for more than 2 hours. If the pump is off for more than 2 hours, the dressing will need to be changed.  If your health care provider says it is okay to shower: ? Do not take the pump into the shower. ? Make sure the wound dressing is protected and sealed. The wound dressing must stay dry.  Check frequently that the machine indicates that therapy is on and that all clamps are open.  Do not use over-the-counter medicated or antiseptic creams, sprays, liquids, or dressings unless your health care provider approves. Contact a health care provider if:  You have new pain.  You develop irritation, a rash, or itching around the wound or dressing.  You see new black or yellow tissue in your wound.  The dressing changes are painful or cause bleeding.  The pump has been off for more than 2 hours, and you do not know how to change the dressing.  The pump alarm goes off, and you do not know what to do. Get help right away if:  You have a lot of bleeding.  The wound breaks open.  You have severe pain.  You have signs of infection, such as: ? More redness, swelling, or pain. ? More fluid or blood. ? Warmth. ? Pus or a bad smell. ? Red streaks leading from the wound. ? A fever.  You see a sudden change in the color or texture of the drainage.  You have signs of dehydration, such as: ? Little or no tears,  urine, or sweat. ? Muscle cramps. ? Very dry mouth. ? Headache. ? Dizziness. Summary  Negative pressure wound therapy (NPWT) is a device that helps your wound heal.  Set up a clean station for wound care. Your health care provider will tell you what supplies to use.  Follow your health care provider's instructions on how to clean your wound and how to change the dressing.  Contact a health care provider if you have new pain, an irritation, or a rash, or if the alarm goes off and  you do not know what to do.  Get help right away if you have a lot of bleeding, your wound breaks open, or you have severe pain. Also, get help if you have signs of infection. This information is not intended to replace advice given to you by your health care provider. Make sure you discuss any questions you have with your health care provider. Document Released: 05/03/2011 Document Revised: 06/02/2018 Document Reviewed: 04/28/2018 Elsevier Patient Education  2020 [place] were seen for an infection of your joint and were found to have bacteria in your blood and in your heart. It is important that you continue taking IV antibiotics as an outpatient to ensure we get rid of the bacteria. It is also important that you followup with the infectious disease doctors for further management. Please call your doctor or return to the emergency room if you have fever, chills, purulent drainage, changes in strength or sensation, or any other concerning symptom.

## 2018-11-06 NOTE — Progress Notes (Signed)
  Subjective:  Patient reports that she is doing well this morning.  Denies fever, chills.  States she is comfortable going home.     Objective:    Vital Signs (last 24 hours): Vitals:   11/05/18 0955 11/05/18 1748 11/05/18 1918 11/06/18 0444  BP: (!) 104/92 (!) 102/58 (!) 119/55 (!) 142/75  Pulse: 70 83 83 88  Resp: 17 18 17 18   Temp: 98 F (36.7 C) 98.3 F (36.8 C) 98.4 F (36.9 C) 98.4 F (36.9 C)  TempSrc: Oral Oral Oral Oral  SpO2: 100% 97% 92% 98%  Weight:      Height:         Physical Exam: General Alert and answers questions appropriately, no acute distress  MSK No tenderness to palpation, improved ROM of shoulder  Skin Without rashes, bleeding, swelling    Assessment/Plan:   Principal Problem:   MRSA bacteremia Active Problems:   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  She has had consistent improvement following the I&D and wound VAC placement.  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  # MRSA bacteremia with septic arthritis and native valve endocarditis: Patient denies acute complaints.  Creatinine has been within normal limits.  Patient to continue on IV vancomycin with end date of December 06, 2018.  PICC line in place, IV antibiotics will be provided outpatient by [facility] (contracted out to [facility] for IV/PICC care).  Per  thoracic surgery, okay to discontinue wound VAC and do saline wet-to-dry dressings twice daily.  Patient to follow-up in [facility] in 1 week.  Diet: Regular DVT Ppx: Lovenox Dispo: Anticipated discharge today  Jeanmarie Hubert, MD 11/06/2018, 10:30 AM  Pager: [phone]

## 2018-11-06 NOTE — Progress Notes (Signed)
12 Days Post-Op Procedure(s) (LRB): I&D Right sternoclavicular joint (Right) TRANSESOPHAGEAL ECHOCARDIOGRAM (TEE) (N/A) Subjective: Out of bed. Says she is feeling well and has no complaints.   Objective: Vital signs in last 24 hours: Temp:  [98 F (36.7 C)-98.4 F (36.9 C)] 98.4 F (36.9 C) (09/14 0444) Pulse Rate:  [70-88] 88 (09/14 0444) Cardiac Rhythm: Sinus tachycardia (09/14 0700) Resp:  [17-18] 18 (09/14 0444) BP: (102-142)/(55-92) 142/75 (09/14 0444) SpO2:  [92 %-100 %] 98 % (09/14 0444)    Intake/Output from previous day: 09/13 0701 - 09/14 0700 In: 720 [P.O.:720] Out: -  Intake/Output this shift: No intake/output data recorded.  Physical Exam: General appearance:alert, cooperative and no distress Wound:The wound vac was removed at the bedside this AM.  There is excellent granulation with no exudate or devitalized tissue. There was no significant undermining. The wound is ~1cm at its widest point and ~ 1cm deep.  Minimal wound drainage. Surrounding tissues remain healthy.The wound was re-dressed with a 2x2 gauze moistened with saline and a dry 2x2 cover.   Lab Results: Recent Labs    11/04/18 0449  WBC 7.8  HGB 10.7*  HCT 35.1*  PLT 308   BMET:  Recent Labs    11/04/18 0449 11/05/18 0843  NA 138 137  K 3.7 3.5  CL 105 104  CO2 25 25  GLUCOSE 105* 108*  BUN 15 14  CREATININE 1.06* 0.95  CALCIUM 9.2 9.3    PT/INR: No results for input(s): LABPROT, INR in the last 72 hours. ABG    Component Value Date/Time   PHART 7.465 (H) 10/25/2018 0935   HCO3 28.1 (H) 10/25/2018 0935   O2SAT 89.7 10/25/2018 0935   CBG (last 3)  Recent Labs    11/05/18 1745 11/05/18 2021 11/06/18 0626  GLUCAP 81 94 85    Assessment/Plan: S/P Procedure(s) (LRB): I&D Right sternoclavicular joint (Right) TRANSESOPHAGEAL ECHOCARDIOGRAM (TEE) (N/A)  -POD12 incision and drainage of right [place] joint abscess and wound vac placement.  Initial wound Cx and blood cx positive  for MRSA. Continue Vanc per PICC as recommended by ID. OK to discontinue wound vac and do saline moist to dry dressings BID. Will plan to follow up in the office in 1 week for wound check.    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 ID following. IV Vanc for 6 weeks planned. F/U echo in 3-4 weeks.   LOS: 13 days    Tiffany Bullock, [place] [phone] 11/06/2018

## 2018-11-06 NOTE — TOC Transition Note (Signed)
Transition of Care [facility]) - CM/SW Discharge Note   Patient Details  Name: Tiffany Bullock MRN: [id number] Date of Birth: 11/04/1960  Transition of [facility]) CM/SW Contact:  Midge Minium RN, BSN, NCM-BC, ACM-RN [phone] Phone Number: 11/06/2018, 11:13 AM   Clinical Narrative:    Patient medically stable to transition home with LT IV ABT; wound vac has been discontinued. Patient will receive her 1800 IV Vanc dosage prior to discharging home, with [facility] on 11/07/18. Home infusion arranged with [facility] (Advanced infusion) and [facility] for [facility] (IV/PICC care). Patients family will provide transportation home.    Final next level of care: [place] Barriers to Discharge: No Barriers Identified   Patient Goals and CMS Choice Patient states their goals for this hospitalization and ongoing recovery are:: Pt states she would perform all ADLS independently CMS Medicare.gov Compare Post Acute Care list provided to:: Patient Choice offered to / list presented to : Patient   Discharge Plan and Services     Post Acute Care Choice: Durable Medical Equipment, Home Health            HH Arranged: RN [facility] Agency: [facility] Date [place]: 11/06/18 Time HH Agency Contacted: 1110 Representative spoke with at [place]: [place] (liaison)  Social Determinants of Health (SDOH) Interventions     Readmission Risk Interventions No flowsheet data found.

## 2018-11-08 NOTE — Discharge Summary (Signed)
Name: Tiffany Bullock MRN: [id number] DOB: [birth year] 58 y.o. PCP: [facility]  Date of Admission: 10/23/2018  9:47 PM Date of Discharge: 11/06/2018 Attending Physician: Dr. Evette Doffing  Discharge Diagnosis: 1.  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   ferrous sulfate 325 (65 FE) MG tablet Take 325 mg by mouth daily with breakfast.   metFORMIN 500 MG tablet Commonly known as: GLUCOPHAGE Take 1 tablet by mouth daily.   valsartan-hydrochlorothiazide 320-25 MG tablet Commonly known as: DIOVAN-HCT Take 1 tablet by mouth daily.   vancomycin  IVPB Inject 1,750 mg into the vein daily. Indication:  Septic arthritis of [place] joint, MRSA bacteremia and endocarditis Last Day of Therapy:  12/06/18 Labs - _0 /07/20 1336          Disposition and follow-up:   [name] was discharged from [facility] in Stable condition.  At the hospital follow up visit please address:  1.  Please assess patient's surgical incision site for healing, signs of infection.   2.  Labs / imaging needed at time of follow-up: BMP to assess renal function given vancomycin therapy  3.  Pending labs/ test needing follow-up: None  Follow-up Appointments: Follow-up Information    [place]. Schedule an appointment as soon as possible for a visit.   Why: I have sent a message to the clinic to schedule you for an appointment. I would however, encourage you to call and make certain one has been placed as well.  Contact information: [address] [place] [phone]  Powers, Evern Core, MD Follow up on 11/21/2018.   Specialty: Infectious Diseases Why: 9:15 a.m. appointment with Dr. Prince Rome. Please arrive 15 minutes early to register.  Contact information: [address] [place] [address] [phone]        Grace Isaac, MD Follow up.   Specialty: Cardiothoracic Surgery Why: Office will contact you with appointment date and time  Contact information: [place] [address] [place]. Follow up.   Why: Home Infusion; Home Health Registered Nurse for home infusion teach is coordinated with [place] information: [place], [place] [address]  Phone:  [phone]          Hospital Course by problem list: # MRSA bacteremia with septic arthritis and native valve endocarditis: Patient presented with acute onset of right clavicular pain and was found to have sclerotic/erosive changes at the right sternoclavicular joint concerning for septic arthritis.  Patient febrile to 100.9 on presentation with white count of 11.3.  IR consulted, no fluid collection for aspiration so patient was started empirically on IV vancomycin on 9/1.  Blood cultures (2/2) grew MRSA.  I&D of right sternoclavicular joint performed by TCTS on 9/2 with wound VAC placed, and intraoperative cultures grew MRSA.  Repeat blood cultures were negative at 48 hours and PICC line was placed.  TEE revealed small nonmobile vegetation on mitral valve and possible small tricuspid valve vegetation on lateral TV leaflet.  Operative wound had appropriate healing and wound VAC was discontinued on 9/12 and patient was instructed on use of saline wet-to-dry dressings. Patient's hospitalization was complicated by difficulty getting insurance approval for outpatient antibiotics.  Upon insurance approval, patient was discharged on course of outpatient IV vancomycin with end date of 12/06/2018.   # Numbness of left lateral leg: Patient reported numbness and discomfort of left lateral leg on 9/11.  No other focal neurologic deficits on exam.  Given higher embolic risk with endocarditis, MR brain was conducted which did not show any acute infarct.   Discharge Vitals:   BP 134/71 (BP Location: Right Arm)   Pulse 83   Temp 98.3 F (36.8 C) (Oral)   Resp 20   Ht _0  (1.702 m)   Wt (!) 173 kg   LMP 07/24/2011   SpO2 100%    BMI 59.73 kg/m   Pertinent Labs, Studies, and Procedures:  CBC Latest Ref Rng & Units 11/04/2018 10/30/2018 10/29/2018  WBC 4.0 - 10.5 K/uL 7.8 9.0 7.9  Hemoglobin 12.0 - 15.0 g/dL 10.7(L) 10.6(L) 11.2(L)  Hematocrit 36.0 - 46.0 % 35.1(L) 34.8(L) 36.7  Platelets 150 - 400 K/uL 308 308 333   BMP Latest Ref Rng & Units 11/05/2018 11/04/2018 11/03/2018  Glucose 70 - 99 mg/dL 108(H) 105(H) 110(H)  BUN 6 - 20 mg/dL _1 Creatinine 0.44 - 1.00 mg/dL 0.95 1.06(H) 0.92  Sodium 135 - 145 mmol/L 137 138 139  Potassium 3.5 - 5.1 mmol/L 3.5 3.7 3.8  Chloride 98 - 111 mmol/L 104 105 106  CO2 22 - 32 mmol/L _2 Calcium 8.9 - 10.3 mg/dL 9.3 9.2 9.2   DG Shoulder Right (10/23/18): IMPRESSION: 1. No acute osseous abnormality.  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 2. Small soft tissue calcification in the region of the rotator cuff insertion may represent calcific tendinopathy.  CT Angio Chest PE W or WO Contrast (10/24/2018): IMPRESSION:  1. Suboptimal opacification of the pulmonary arteries due to poor contrast bolus, motion artifact and streak from the retained contrast in the superior vena cava. This limits evaluation of the segmental and more distal pulmonary emboli. No large central pulmonary embolus, pulmonary artery enlargement or evidence of right heart strain. 2. Abnormal sclerosis and erosive change at the right sternoclavicular joint with adjacent synovial thickening and surrounding phlegmonous change. Inflammatory features appear to extend to the right first sternocostal joint as well. Findings are concerning for septic arthritis. 3. Aortic Atherosclerosis (ICD10-I70.0).  CT Shoulder Right WO Contrast (10/24/2018): IMPRESSION: 1. Suboptimal opacification of the pulmonary arteries due to poor contrast bolus, motion artifact and streak from the retained contrast in the superior vena cava. This limits evaluation of the segmental and more distal pulmonary emboli. No large central  pulmonary embolus, pulmonary artery enlargement or evidence of right heart strain. 2. Abnormal sclerosis and erosive change at the right sternoclavicular joint with adjacent synovial thickening and surrounding phlegmonous change. Inflammatory features appear to extend to the right first sternocostal joint as well. Findings are concerning for septic arthritis. 3. Aortic Atherosclerosis (ICD10-I70.0).  MR Brain WO Contrast (11/01/2018): IMPRESSION: No acute abnormality.  Negative for acute infarct or mass  Scattered small subcortical white matter hyperintensities likely due to chronic microvascular ischemia  TEE (10/25/2018):     Left Ventricle: The left ventricle has normal systolic function, with an ejection fraction of 60-65%. The cavity size was normal. There is no increase in                         left ventricular wall thickness.  Mitral Valve: The mitral valve is normal in structure. Mild thickening of the    mitral valve leaflet. Mitral valve regurgitation is trivial by color flow  Doppler. A small non-mobile vegetation is seen on the anterior mitral leaflet.  Tricuspid Valve: The tricuspid valve was normal in structure. Tricuspid valve    regurgitation is trivial by color flow Doppler. A small vegetation on the                      lateral TV leaflet cannot be excluded.  Left Ventricle: The left ventricle has normal systolic function, with an ejection fraction of 60-65%. The cavity size was normal. There is no increase in left ventricular wall thickness.  Right Ventricle: The right ventricle has normal systolic function. The cavity was normal. There is no increase in right ventricular wall thickness. Right ventricular systolic pressure is mildly elevated.  Left Atrium: Left atrial size was normal in size.  Right Atrium: Right atrial size was normal in size. Right atrial pressure is estimated at 10 mmHg.  Interatrial Septum: No atrial level shunt detected by color flow  Doppler.  Pericardium: There is no evidence of pericardial effusion.  Mitral Valve: The mitral valve is normal in structure. Mild thickening of the mitral valve leaflet. Mitral valve regurgitation is trivial by color flow Doppler. A small non-mobile vegetation is seen on the anterior mitral leaflet.  Tricuspid Valve: The tricuspid valve was normal in structure. Tricuspid valve regurgitation is trivial by color flow Doppler. A small vegetation on the lateral TV leaflet cannot be excluded.  Aortic Valve: The aortic valve is normal in structure. Aortic valve regurgitation was not assessed by color flow Doppler.  Pulmonic Valve: The pulmonic valve was normal in structure, with normal. The gradient recorded across the pulmonic valve is within the expected range. The  gradient recorded across the prosthetic pulmonic valve is within the expected range. Pulmonic valve regurgitation was not assessed by color flow Doppler.   Venous: The inferior vena cava is normal in size with greater than 50% respiratory variability.  TTE (10/25/2018): IMPRESSIONS  1. The left ventricle has normal systolic function with an ejection fraction of 60-65%. The cavity size was normal. Left ventricular diastolic parameters were normal.  2. The right ventricle has normal systolic function. The cavity was normal. There is no increase in right ventricular wall thickness.  3. Mild calcification of the anterior mitral valve leaflet. There is mild mitral annular calcification present.  4. There is a calcified density on the atrial side of the MV apparatus that likely represents mitral annular calcifications. Clinical correlation recommended.  5. The aorta is normal unless otherwise noted.   Discharge Instructions: Discharge Instructions    Diet - low sodium heart healthy   Complete by: As directed    Diet - low sodium heart healthy   Complete by: As directed    Discharge instructions   Complete by: As directed     Please make certain to follow-up with the Infectious Disease doctors. You will need a repeat Echocardiogram of the heart and continued IV Vancomycin.   Home infusion instructions   Complete by: As directed    Instructions: Flushing of vascular access device: 0.9% NaCl pre/post medication administration and prn patency; Heparin 100 u/ml, 331m for implanted ports and Heparin 10u/ml, 537mfor all other central venous catheters.   Home infusion instructions [facility] May follow ACCarrollosing Protocol; May administer Cathflo as needed to maintain patency of vascular access device.; Flushing of vascular access device: per [facility]: 0.9% NaCl pre/post medica...   Complete by: As directed    Instructions: May follow ACWhipholtosing Protocol   Instructions: May administer Cathflo as needed to maintain patency of vascular access device.   Instructions: Flushing of vascular access device: per [facility]otocol: 0.9% NaCl pre/post medication administration and prn patency; Heparin 100 u/ml, 31m32mor implanted ports and Heparin 10u/ml, 31ml78mr all other central venous catheters.   Instructions: May follow [facility] Anaphylaxis Protocol for First Dose Administration in the home: 0.9% NaCl at 25-50 ml/hr to maintain IV access for protocol meds. Epinephrine 0.3 ml IV/IM PRN and Benadryl 25-50 IV/IM PRN s/s of anaphylaxis.   Instructions: AdvaFolsomusion Coordinator (RN) to assist per patient IV care needs in the home PRN.   Increase activity slowly   Complete by: As directed    Increase activity slowly   Complete by: As directed       Signed: MacLJeanmarie Hubert 11/08/2018, 4:27 PM   Pager: [phone]

## 2018-11-12 ENCOUNTER — Other Ambulatory Visit (HOSPITAL_COMMUNITY)
Admission: RE | Admit: 2018-11-12 | Discharge: 2018-11-12 | Disposition: A | Discharge status: Home / Self Care | Payer: BC Managed Care – PPO | Source: Other Acute Inpatient Hospital | Attending: Infectious Diseases | Admitting: Infectious Diseases

## 2018-11-12 DIAGNOSIS — A4102 Sepsis due to Methicillin resistant Staphylococcus aureus: Secondary | ICD-10-CM | POA: Diagnosis not present

## 2018-11-12 LAB — CBC WITH DIFFERENTIAL/PLATELET
Abs Immature Granulocytes: 0.02 10*3/uL (ref 0.00–0.07)
Basophils Absolute: 0.1 10*3/uL (ref 0.0–0.1)
Basophils Relative: 1 %
Eosinophils Absolute: 0.3 10*3/uL (ref 0.0–0.5)
Eosinophils Relative: 4 %
HCT: 37.9 % (ref 36.0–46.0)
Hemoglobin: 11.2 g/dL — ABNORMAL LOW (ref 12.0–15.0)
Immature Granulocytes: 0 %
Lymphocytes Relative: 27 %
Lymphs Abs: 1.9 10*3/uL (ref 0.7–4.0)
MCH: 25.8 pg — ABNORMAL LOW (ref 26.0–34.0)
MCHC: 29.6 g/dL — ABNORMAL LOW (ref 30.0–36.0)
MCV: 87.3 fL (ref 80.0–100.0)
Monocytes Absolute: 0.5 10*3/uL (ref 0.1–1.0)
Monocytes Relative: 7 %
Neutro Abs: 4.3 10*3/uL (ref 1.7–7.7)
Neutrophils Relative %: 61 %
Platelets: 187 10*3/uL (ref 150–400)
RBC: 4.34 MIL/uL (ref 3.87–5.11)
RDW: 15 % (ref 11.5–15.5)
WBC: 7 10*3/uL (ref 4.0–10.5)
nRBC: 0 % (ref 0.0–0.2)

## 2018-11-12 LAB — BASIC METABOLIC PANEL
Anion gap: 11 (ref 5–15)
BUN: 24 mg/dL — ABNORMAL HIGH (ref 6–20)
CO2: 25 mmol/L (ref 22–32)
Calcium: 9.8 mg/dL (ref 8.9–10.3)
Chloride: 104 mmol/L (ref 98–111)
Creatinine, Ser: 1.15 mg/dL — ABNORMAL HIGH (ref 0.44–1.00)
GFR calc Af Amer: >60 mL/min (ref >60)
GFR calc non Af Amer: 53 mL/min — ABNORMAL LOW (ref >60)
Glucose, Bld: 77 mg/dL (ref 70–99)
Potassium: 3.5 mmol/L (ref 3.5–5.1)
Sodium: 140 mmol/L (ref 135–145)

## 2018-11-12 LAB — SEDIMENTATION RATE: Sed Rate: 31 mm/hr — ABNORMAL HIGH (ref 0–22)

## 2018-11-12 LAB — C-REACTIVE PROTEIN: CRP: 1.8 mg/dL — ABNORMAL HIGH (ref <1.0)

## 2018-11-12 LAB — VANCOMYCIN, TROUGH: Vancomycin Tr: 6 ug/mL — ABNORMAL LOW (ref 15–20)

## 2018-11-13 ENCOUNTER — Ambulatory Visit: Payer: Self-pay | Admitting: Physician Assistant

## 2018-11-13 ENCOUNTER — Encounter: Payer: Self-pay | Admitting: Physician Assistant

## 2018-11-13 ENCOUNTER — Other Ambulatory Visit: Payer: Self-pay

## 2018-11-13 ENCOUNTER — Ambulatory Visit (INDEPENDENT_AMBULATORY_CARE_PROVIDER_SITE_OTHER): Payer: BC Managed Care – PPO | Admitting: Internal Medicine

## 2018-11-13 VITALS — BP 127/83 | HR 82 | Temp 98.6°F | Ht 67.0 in | Wt 378.9 lb

## 2018-11-13 VITALS — BP 135/91 | HR 88 | Temp 96.6°F | Resp 16 | Ht 67.0 in | Wt 378.0 lb

## 2018-11-13 DIAGNOSIS — M00011 Staphylococcal arthritis, right shoulder: Secondary | ICD-10-CM | POA: Diagnosis not present

## 2018-11-13 DIAGNOSIS — M009 Pyogenic arthritis, unspecified: Secondary | ICD-10-CM

## 2018-11-13 DIAGNOSIS — Z79899 Other long term (current) drug therapy: Secondary | ICD-10-CM

## 2018-11-13 DIAGNOSIS — Z23 Encounter for immunization: Secondary | ICD-10-CM | POA: Diagnosis not present

## 2018-11-13 DIAGNOSIS — I10 Essential (primary) hypertension: Secondary | ICD-10-CM

## 2018-11-13 DIAGNOSIS — R200 Anesthesia of skin: Secondary | ICD-10-CM

## 2018-11-13 DIAGNOSIS — Z6841 Body Mass Index (BMI) 40.0 and over, adult: Secondary | ICD-10-CM

## 2018-11-13 DIAGNOSIS — I058 Other rheumatic mitral valve diseases: Secondary | ICD-10-CM

## 2018-11-13 DIAGNOSIS — R7881 Bacteremia: Secondary | ICD-10-CM | POA: Diagnosis not present

## 2018-11-13 DIAGNOSIS — Z5189 Encounter for other specified aftercare: Secondary | ICD-10-CM

## 2018-11-13 DIAGNOSIS — B9562 Methicillin resistant Staphylococcus aureus infection as the cause of diseases classified elsewhere: Secondary | ICD-10-CM | POA: Diagnosis not present

## 2018-11-13 DIAGNOSIS — I059 Rheumatic mitral valve disease, unspecified: Secondary | ICD-10-CM

## 2018-11-13 DIAGNOSIS — Z792 Long term (current) use of antibiotics: Secondary | ICD-10-CM

## 2018-11-13 DIAGNOSIS — G629 Polyneuropathy, unspecified: Secondary | ICD-10-CM | POA: Insufficient documentation

## 2018-11-13 MED ORDER — GABAPENTIN 300 MG PO CAPS: 300.0000 mg | ORAL_CAPSULE | Freq: Every day | ORAL | 0 refills | Status: DC | PRN

## 2018-11-13 NOTE — Progress Notes (Signed)
  HPI: Patient returns for scheduled postoperative follow-up having undergone incision and drainage of a right sternoclavicular abscess on 10/25/18 by Dr. Servando Snare with subsequent placement of a wound vac.   Both blood and wound cultures were positive for MRSA.  Echocardiography demonstrated a small nonmobile vegetation on mitral valve and possible small tricuspid valve vegetation on lateral TV leaflet.  By the time of her discharge from the hospital on 11/06/18, the right chest wound had healed sufficiently to discontinue the vac therapy and proceed with moist to dry dressings. She has been receiving  IV vancomycin via a PICC line since discharge.   Since hospital discharge the patient reports she has been feeling well except for some leg LE numbness. Her daughter has been assisting her with dressing changes.    Current Outpatient Medications  Medication Sig Dispense Refill  . cholecalciferol (VITAMIN D3) 25 MCG (1000 UT) tablet Take 1,000 Units by mouth daily.    . ferrous sulfate 325 (65 FE) MG tablet Take 325 mg by mouth daily with breakfast.    . gabapentin (NEURONTIN) 300 MG capsule Take 1 capsule (300 mg total) by mouth daily as needed. 30 capsule 0  . metFORMIN (GLUCOPHAGE) 500 MG tablet Take 1 tablet by mouth daily.    . valsartan-hydrochlorothiazide (DIOVAN-HCT) 320-25 MG tablet Take 1 tablet by mouth daily.    . vancomycin IVPB Inject 1,750 mg into the vein daily. Indication:  Septic arthritis of [place] joint, MRSA bacteremia and endocarditis Last Day of Therapy:  12/06/18 Labs - Sunday/Monday:  CBC/D, BMP, and vancomycin trough. Labs - Thursday:  BMP and vancomycin trough Labs - Every other week:  ESR and CRP 30 Units 0   No current facility-administered medications for this visit.     Physical Exam: VS:   BP 135/91 HR 88 RR 16 T 96.6  EXAM: The right chest wound continues to heal well with good granulation. It is less approx. 3cm x 1 1/2 cm x less than 1cm deep.  There is no  undermining and only a trace of drainage on the dressing that had been applied this morning.   Impression / Plan: Continued healing of open wound s/p I&D of sternoclavicular abscess.  I asked her to continue the saline moist to dry dressings twice daily and IV abx as per the ID service.  She has follow up scheduled with Dr. Prince Rome next week.   She will need follow up echo to re-evaluate the mitral valve and possible tricuspid valve lesions in the next 2-3 weeks.      Antony Odea, PA-C [facility] [phone]

## 2018-11-13 NOTE — Assessment & Plan Note (Signed)
Patient presented for hospital follow up for MRSA bacteremia secondary to right sternoclavicular septic arthritis. She was hospitalized from 8/31-9/14 during which time she had an incision and drainage of the right shoulder by TCTS on 9/2 and wound VAC was placed till 9/12. TEE showed small non-mobile vegetation on anterior mitral leaflet and a possible small vegetation on the lateral TV leaflet.   Assessment and plan  The patient is doing well currently on vancomycin. She states that home health has been dressing her wound, which appears clean and without discharge.   -continue vancomycin till 10/14 -follow up with infectious disease  -home health to continue wound care -bmp to assess kidney function

## 2018-11-13 NOTE — Progress Notes (Signed)
CC: Hospital follow up for MRSA Bacteremia  HPI:  Tiffany Bullock is a 58 y.o. with morbid obesity, essential htn, recent mrsa bacteremia infection who presents for hospital follow up of mrsa infection. Please see problem based charting for evaluation, assessment, and plan.  Past Medical History:  Diagnosis Date  . Anemia   . Blood transfusion without reported diagnosis Age 87  . Essential hypertension 10/24/2018  . Obesity (BMI 35.0-39.9 without comorbidity)   . Personal history of colonic polyp - adenoma 11/05/2013  . Sleep apnea    uses cpap    Social History   Socioeconomic History  . Marital status: Single    Spouse name: Not on file  . Number of children: Not on file  . Years of education: Not on file  . Highest education level: Not on file  Occupational History  . Not on file  Social Needs  . Financial resource strain: Not on file  . Food insecurity    Worry: Not on file    Inability: Not on file  . Transportation needs    Medical: Not on file    Non-medical: Not on file  Tobacco Use  . Smoking status: Never Smoker  . Smokeless tobacco: Never Used  Substance and Sexual Activity  . Alcohol use: Yes    Alcohol/week: 1.0 standard drinks    Types: 1 Standard drinks or equivalent per week  . Drug use: No  . Sexual activity: Not Currently    Partners: Male  Lifestyle  . Physical activity    Days per week: Not on file    Minutes per session: Not on file  . Stress: Not on file  Relationships  . Social Herbalist on phone: Not on file    Gets together: Not on file    Attends religious service: Not on file    Active member of club or organization: Not on file    Attends meetings of clubs or organizations: Not on file    Relationship status: Not on file  Other Topics Concern  . Not on file  Social History Narrative  . Not on file    Family History  Adopted: Yes   Review of Systems:    Review of Systems  Constitutional: Negative for  chills and fever.  Respiratory: Negative for shortness of breath.   Cardiovascular: Negative for chest pain.  Gastrointestinal: Negative for abdominal pain, nausea and vomiting.  Neurological: Negative for dizziness and headaches.   Physical Exam:  Vitals:   11/13/18 0952  BP: 127/83  Pulse: 82  Temp: 98.6 F (37 C)  TempSrc: Oral  SpO2: 100%  Weight: (!) 378 lb 14.4 oz (171.9 kg)  Height: 5\' 7"  (1.702 m)   Physical Exam  Constitutional: She is oriented to person, place, and time. She appears well-developed and well-nourished. No distress.  HENT:  Head: Normocephalic and atraumatic.  Cardiovascular: Normal rate, regular rhythm and normal heart sounds.  Respiratory: Effort normal and breath sounds normal. No respiratory distress. She has no wheezes.  GI: Soft. Bowel sounds are normal. She exhibits no distension. There is no abdominal tenderness.  Musculoskeletal:        General: No edema.     Comments: 5/5 strength in bilateral lower extremities. Sensation intact in bilateral lower extremities.  Neurological: She is alert and oriented to person, place, and time. No cranial nerve deficit.  Skin: She is not diaphoretic.  Psychiatric: She has a normal mood and affect. Her behavior  is normal. Judgment and thought content normal.   Assessment & Plan:   See Encounters Tab for problem based charting.  Patient discussed with Dr. Philipp Ovens

## 2018-11-13 NOTE — Patient Instructions (Addendum)
It was a pleasure to see you today [name]. Please make the following changes:  I am happy that you are doing well. Your wound looks good. Please continue to use vancomycin. I have checked your bloodwork today to make sure you do not have any adverse effects.   Please follow up with infectious disease and cardiothoracic surgery   For your left leg numbness it appears that you have a condition called meralgia paresthetica. You can use gabapentin 300mg  daily as needed  If you have any questions or concerns, please call our clinic at [phone] between 9am-5pm and after hours call [phone] and ask for the internal medicine resident on call. If you feel you are having a medical emergency please call 911.   Thank you, we look forward to help you remain healthy!  Lars Mage, MD Internal Medicine PGY3

## 2018-11-13 NOTE — Patient Instructions (Signed)
Continue saline moist to dry dressing changes to the chest wound twice daily.  Follow up in 2 weeks.

## 2018-11-14 LAB — BMP8+ANION GAP
Anion Gap: 19 mmol/L — ABNORMAL HIGH (ref 10.0–18.0)
BUN/Creatinine Ratio: 19 (ref 9–23)
BUN: 19 mg/dL (ref 6–24)
CO2: 18 mmol/L — ABNORMAL LOW (ref 20–29)
Calcium: 10.1 mg/dL (ref 8.7–10.2)
Chloride: 104 mmol/L (ref 96–106)
Creatinine, Ser: 0.98 mg/dL (ref 0.57–1.00)
GFR calc Af Amer: 74 mL/min/{1.73_m2} (ref >59)
GFR calc non Af Amer: 64 mL/min/{1.73_m2} (ref >59)
Glucose: 102 mg/dL — ABNORMAL HIGH (ref 65–99)
Potassium: 4.2 mmol/L (ref 3.5–5.2)
Sodium: 141 mmol/L (ref 134–144)

## 2018-11-14 NOTE — Progress Notes (Signed)
Internal Medicine Clinic Attending  Case discussed with Dr. Chundi at the time of the visit.  We reviewed the resident's history and exam and pertinent patient test results.  I agree with the assessment, diagnosis, and plan of care documented in the resident's note. 

## 2018-11-14 NOTE — Assessment & Plan Note (Signed)
Patient states that she has been having pain on the left lateral aspect of left leg. She describes the leg discomfort as feeling tight/achy in nature especially when she bends her leg. Somewhat relieved with muscle relaxant.   She has accompanied numbness and tinging worse at night. Denies back pain, fecal incontinence, claudication, or no other numbness or tingling   Assessment and plan Patient had mri brain during hospitalization which was negative for septic emboli.   Patient bmi in obese range which puts her at risk for meralgia paresthetica. Location of patient's symptoms fits with nature of disease. Encouraged patient to work on weight reduction. Prescribed gabapentin for symptomatic relief.   -gabapentin 300mg  qhs prn  -weight reduction

## 2018-11-15 ENCOUNTER — Other Ambulatory Visit: Payer: Self-pay

## 2018-11-15 ENCOUNTER — Encounter (HOSPITAL_BASED_OUTPATIENT_CLINIC_OR_DEPARTMENT_OTHER): Payer: BC Managed Care – PPO | Attending: Physician Assistant

## 2018-11-15 DIAGNOSIS — L02213 Cutaneous abscess of chest wall: Secondary | ICD-10-CM | POA: Insufficient documentation

## 2018-11-15 DIAGNOSIS — E11622 Type 2 diabetes mellitus with other skin ulcer: Secondary | ICD-10-CM | POA: Diagnosis not present

## 2018-11-15 DIAGNOSIS — Z7984 Long term (current) use of oral hypoglycemic drugs: Secondary | ICD-10-CM | POA: Diagnosis not present

## 2018-11-15 DIAGNOSIS — G4730 Sleep apnea, unspecified: Secondary | ICD-10-CM | POA: Insufficient documentation

## 2018-11-15 DIAGNOSIS — L98492 Non-pressure chronic ulcer of skin of other sites with fat layer exposed: Secondary | ICD-10-CM | POA: Diagnosis not present

## 2018-11-15 DIAGNOSIS — I330 Acute and subacute infective endocarditis: Secondary | ICD-10-CM | POA: Insufficient documentation

## 2018-11-15 DIAGNOSIS — I10 Essential (primary) hypertension: Secondary | ICD-10-CM | POA: Diagnosis not present

## 2018-11-16 ENCOUNTER — Other Ambulatory Visit (HOSPITAL_COMMUNITY)
Admission: RE | Admit: 2018-11-16 | Discharge: 2018-11-16 | Disposition: A | Discharge status: Home / Self Care | Payer: BC Managed Care – PPO | Source: Other Acute Inpatient Hospital | Attending: Infectious Diseases | Admitting: Infectious Diseases

## 2018-11-16 ENCOUNTER — Telehealth: Payer: Self-pay

## 2018-11-16 DIAGNOSIS — A4102 Sepsis due to Methicillin resistant Staphylococcus aureus: Secondary | ICD-10-CM | POA: Diagnosis present

## 2018-11-16 LAB — BASIC METABOLIC PANEL
Anion gap: 11 (ref 5–15)
BUN: 18 mg/dL (ref 6–20)
CO2: 24 mmol/L (ref 22–32)
Calcium: 9.2 mg/dL (ref 8.9–10.3)
Chloride: 103 mmol/L (ref 98–111)
Creatinine, Ser: 1.46 mg/dL — ABNORMAL HIGH (ref 0.44–1.00)
GFR calc Af Amer: 46 mL/min — ABNORMAL LOW (ref >60)
GFR calc non Af Amer: 40 mL/min — ABNORMAL LOW (ref >60)
Glucose, Bld: 76 mg/dL (ref 70–99)
Potassium: 3.5 mmol/L (ref 3.5–5.1)
Sodium: 138 mmol/L (ref 135–145)

## 2018-11-16 LAB — VANCOMYCIN, TROUGH: Vancomycin Tr: 7 ug/mL — ABNORMAL LOW (ref 15–20)

## 2018-11-16 NOTE — Telephone Encounter (Signed)
Unable to reach patient regarding disability form. The form is completed and ready for patient to pick-up. Please have patient to completed her part before giving the form to her.

## 2018-11-17 NOTE — Progress Notes (Signed)
Sure thing. She does look tricky. If you look at the pharmacist's note from inpatient on 9/13, it looks like she has been all over the place with her dosing. She is a bigger lady (171 kg) so that also makes things tricky with vanc.  And her AUC and troughs did not match.  She was loaded on 9/1 with 2500 mg x 1. Then was started on 2000 mg daily from 9/2-9/3. Her SCr improved so they recalculated the dose and changed her to 2500 mg daily from 9/3 to 9/5.  Her SCr again changed so they redid it again and changed her to 2250 mg daily from 9/5 to 9/9.  On 9/9 her VT was 14 and AUC was 652 (goal 400-550) on the 2250 mg daily so they decreased her to 1750 mg daily from 9/9 to 9/13. On 9/13 her VT was 10 and her AUC was 484. So they continued.  She was then discharged and came to [facility] on 1750 daily.  Last sunday on 9/20, her VT was 6 so [facility] increased her to 2000 mg daily. [facility] (Amy, another pharmacist that works there) was confused and concerned about what to do as she saw the inpatient notes and where the doses were all over the place. She did say that the 2000 mg did not start until yesterday evening so the VT and SCr of 1.46 was before the dose increase.  She may need dapto if they can't get her therapeutic without her SCr increasing.

## 2018-11-21 ENCOUNTER — Encounter: Payer: Self-pay | Admitting: Infectious Diseases

## 2018-11-21 ENCOUNTER — Ambulatory Visit (INDEPENDENT_AMBULATORY_CARE_PROVIDER_SITE_OTHER): Payer: BC Managed Care – PPO | Admitting: Infectious Diseases

## 2018-11-21 ENCOUNTER — Other Ambulatory Visit: Payer: Self-pay

## 2018-11-21 VITALS — BP 174/99 | HR 73 | Temp 98.2°F

## 2018-11-21 DIAGNOSIS — I058 Other rheumatic mitral valve diseases: Secondary | ICD-10-CM

## 2018-11-21 DIAGNOSIS — A4102 Sepsis due to Methicillin resistant Staphylococcus aureus: Secondary | ICD-10-CM | POA: Diagnosis not present

## 2018-11-21 DIAGNOSIS — M009 Pyogenic arthritis, unspecified: Secondary | ICD-10-CM | POA: Diagnosis not present

## 2018-11-21 DIAGNOSIS — I059 Rheumatic mitral valve disease, unspecified: Secondary | ICD-10-CM

## 2018-11-21 NOTE — Patient Instructions (Signed)
[facility] sends you new doses of vanc (should now be dosing twice a day). Take vancomycin on strict schedule and continue until 12/06/2018.

## 2018-11-21 NOTE — Progress Notes (Signed)
Subjective:    Patient ID: Tiffany Bullock, female    DOB: [birth year], 58 y.o.   MRN: [id number]  HPI the patient is a very pleasant 58 year old morbidly obese African-American female who is presenting today for recent follow-up from an admission to [facility] for MRSA sepsis/mitral valve endocarditis complicated by septic pulmonary emboli and right [place] joint septic arthritis.  While hospitalized, the patient required CT surgery to debride her [place] joint.  Cultures from that side also confirmed MRSA as was noted in her blood.  Her antibiotics were de-escalated to vancomycin monotherapy with only slightly subtherapeutic levels.  Post discharge the patient's wound VAC to her right subclavian joint was changed to wet-to-dry dressings.  She is continue to show improvement from this wound with today's exam showing a very shallow mostly healed lesion with no active drainage underlying her occlusive dressing.  Her only complaint today is transient numbness to the lateral aspect of her left leg particularly with efforts to ambulate.  She denies any back pain or true pain to her left knee or left hip.  She has been continuing IV antibiotics is not missed doses since returning home.   Past Medical History:  Diagnosis Date  . Anemia   . Blood transfusion without reported diagnosis Age 58  . Endocarditis of mitral valve    with MRSA, tx'ed in 09/4694 complicated by septic PEs and RT [place] septic arthritis  . Essential hypertension 10/24/2018  . Obesity (BMI 35.0-39.9 without comorbidity)   . Personal history of colonic polyp - adenoma 11/05/2013  . Sleep apnea    uses cpap    Past Surgical History:  Procedure Laterality Date  . back surgery  Age 58   Fatty tumor removed  . BOWEL RESECTION  2003   Bowel Obstruction Surgery  . COLONOSCOPY N/A 11/05/2013   Procedure: COLONOSCOPY;  Surgeon: [name], MD;  Location: [facility];  Service: Endoscopy;  Laterality: N/A;  . STERNAL WOUND DEBRIDEMENT  Right 10/25/2018   Procedure: I&D Right sternoclavicular joint;  Surgeon: Grace Isaac, MD;  Location: [facility];  Service: Thoracic;  Laterality: Right;  . TEE WITHOUT CARDIOVERSION N/A 10/25/2018   Procedure: TRANSESOPHAGEAL ECHOCARDIOGRAM (TEE);  Surgeon: Adrian Prows, MD;  Location: [facility];  Service: Cardiovascular;  Laterality: N/A;  . TUBAL LIGATION  15 years ago     Family History  Adopted: Yes     Social History   Tobacco Use  . Smoking status: Never Smoker  . Smokeless tobacco: Never Used  Substance Use Topics  . Alcohol use: Yes    Alcohol/week: 1.0 standard drinks    Types: 1 Standard drinks or equivalent per week  . Drug use: No      reports previously being sexually active and has had partner(s) who are Female.   Outpatient Medications Prior to Visit  Medication Sig Dispense Refill  . cholecalciferol (VITAMIN D3) 25 MCG (1000 UT) tablet Take 1,000 Units by mouth daily.    . ferrous sulfate 325 (65 FE) MG tablet Take 325 mg by mouth daily with breakfast.    . gabapentin (NEURONTIN) 300 MG capsule Take 1 capsule (300 mg total) by mouth daily as needed. 30 capsule 0  . metFORMIN (GLUCOPHAGE) 500 MG tablet Take 1 tablet by mouth daily.    . valsartan-hydrochlorothiazide (DIOVAN-HCT) 320-25 MG tablet Take 1 tablet by mouth daily.    . vancomycin IVPB Inject 1,750 mg into the vein daily. Indication:  Septic arthritis of [place] joint, MRSA  bacteremia and endocarditis Last Day of Therapy:  12/06/18 Labs - Sunday/Monday:  CBC/D, BMP, and vancomycin trough. Labs - Thursday:  BMP and vancomycin trough Labs - Every other week:  ESR and CRP 30 Units 0   No facility-administered medications prior to visit.      No Known Allergies    Review of Systems  Constitutional: Positive for fatigue. Negative for chills and fever.  HENT: Negative for congestion, hearing loss and sinus pressure.   Eyes: Negative for photophobia, discharge, redness and visual disturbance.  Respiratory: Negative  for apnea, cough, shortness of breath and wheezing.   Cardiovascular: Negative for chest pain and leg swelling.  Gastrointestinal: Negative for abdominal distention, abdominal pain, constipation, diarrhea, nausea and vomiting.  Endocrine: Negative for cold intolerance, heat intolerance, polydipsia and polyuria.  Genitourinary: Negative for dysuria, flank pain, frequency, urgency, vaginal bleeding and vaginal discharge.  Musculoskeletal: Positive for myalgias. Negative for arthralgias, back pain, joint swelling and neck pain.  Skin: Negative for pallor and rash.  Allergic/Immunologic: Negative for immunocompromised state.  Neurological: Positive for numbness. Negative for dizziness, seizures, speech difficulty, weakness and headaches.       Transient LT leg numbness since hospital d/c  Hematological: Does not bruise/bleed easily.  Psychiatric/Behavioral: Negative for agitation, confusion, hallucinations and sleep disturbance. The patient is not nervous/anxious.        Objective:     Vitals:   11/21/18 0902  BP: (!) 174/99  Pulse: 73  Temp: 98.2 F (36.8 C)     Physical Exam Gen: pleasant, obese, NAD, A&Ox 3 Head: NCAT, no temporal wasting evident EENT: PERRL, EOMI, MMM, adequate dentition Neck: supple, no JVD CV: heart sounds are distant due to body habitus, NRRR, no murmurs evident, RT [place] joint/subclavian wound c/d with slight packing to wound. No erythema or drainage noted from wound. Pulm: CTA bilaterally, no wheeze or retractions Abd: soft, obese with large pannus, NTND, +BS Extrems:  1+ non-pitting LE edema, 2+ pulses Skin: no rashes, adequate skin turgor Neuro: CN II-XII grossly intact, no focal neurologic deficits appreciated, gait was slowed and wide stanced, A&Ox 3   Labs: Lab Results  Component Value Date   WBC 7.0 11/12/2018   HGB 11.2 (L) 11/12/2018   HCT 37.9 11/12/2018   MCV 87.3 11/12/2018   PLT 187 11/12/2018   Lab Results  Component Value Date   NA 138  11/16/2018   K 3.5 11/16/2018   CL 103 11/16/2018   CO2 24 11/16/2018   GLUCOSE 76 11/16/2018   BUN 18 11/16/2018   CREATININE 1.46 (H) 11/16/2018   CALCIUM 9.2 11/16/2018   MG 2.1 10/24/2018   Lab Results  Component Value Date   CRP 1.8 (H) 11/12/2018   Vancomycin trough - 6 on 11/12/2018     Assessment & Plan:  Patient is a 58 year old morbidly obese African-American female with recent MRSA sepsis/mitral valve endocarditis complicated by septic pulmonary emboli and a right sternoclavicular septic joint status post debridement.  1.  MRSA sepsis/mitral valve endocarditis- in the beginning of September 2020, the patient was admitted for MRSA sepsis that was complicated by mitral valve endocarditis noted on TEE and septic pulmonary emboli.  She did show chronic clearance of her bacteremia while on vancomycin in the hospital with adequate levels.  Unfortunately, since her hospital discharge her vancomycin levels have been progressively low as I am finding the she was continue to receive once daily dosing of her vancomycin.  I have had multiple and extended conversations with  our infectious these pharmacist as well as home health in an effort to change her vancomycin dosing, pease see comments below.  We will increase her vancomycin dosing as noted below and continue her antibiotic course until December 06, 2018 to complete a targeted 6-week course of treatment.  As she has left-sided endocarditis, she does not have a good backup option for treatment such as daptomycin as this medication would offer worsening outcomes.  2.  Right sternoclavicular septic joint infection- cardiovascular surgery debrided the patient's right [place] joint while she was hospitalized earlier this month.  She had a wound VAC intact while hospitalized, and at the time of hospital discharge her wound care was changed to wet-to-dry dressings.  Will continue vancomycin as noted above as MRSA was also cultured from the site as well.   Her inflammatory markers have shown significant improvement with a CRP now down to 1.8 with reference range being less than 1.  She has an appointment to see her cardiac surgeon next week per her report.  3.  Morbid obesity- the patient significant weight of 387 pounds in conjunction with improving renal function with creatinine now down to 1.15 makes her a poor candidate for once daily dosing of vancomycin.  Even at 2250 mg of IV vancomycin given daily for antibiotic levels remain sharply subtherapeutic.  Following my discussion with our ID pharmacist, I would recommend changing the patient's vancomycin to 1500 mg IV every 12 hours which may still leave her subtherapeutic but will hopefully allow [place] to get levels closer to her goal vancomycin trough of 15-20.  We will continue to check BMPs and vancomycin levels twice weekly till treatment completion.  Our clinic ID pharmacist will continue to monitor this issue and bring further dosing challenges to my attention.

## 2018-11-22 ENCOUNTER — Telehealth: Payer: Self-pay | Admitting: Pharmacist

## 2018-11-22 DIAGNOSIS — L02213 Cutaneous abscess of chest wall: Secondary | ICD-10-CM | POA: Diagnosis not present

## 2018-11-22 NOTE — Telephone Encounter (Signed)
Called and spoke to Amy, Pharmacist at [facility], regarding patient's labs and vancomycin dosing.  Her SCr has improved and is now 0.73 with a vanc trough of 5.9.  Patient was receiving 2000 mg IV daily.  Discussed with Dr. Prince Rome and Amy and patient will be changed to 1500 mg IV q12h. Will keep my eye out for labs later this week/early next week.

## 2018-11-27 ENCOUNTER — Other Ambulatory Visit (HOSPITAL_COMMUNITY)
Admission: RE | Admit: 2018-11-27 | Discharge: 2018-11-27 | Disposition: A | Discharge status: Home / Self Care | Payer: BC Managed Care – PPO | Source: Other Acute Inpatient Hospital | Attending: Infectious Diseases | Admitting: Infectious Diseases

## 2018-11-27 DIAGNOSIS — A4102 Sepsis due to Methicillin resistant Staphylococcus aureus: Secondary | ICD-10-CM | POA: Diagnosis not present

## 2018-11-27 LAB — CBC WITH DIFFERENTIAL/PLATELET
Abs Immature Granulocytes: 0.04 10*3/uL (ref 0.00–0.07)
Basophils Absolute: 0.1 10*3/uL (ref 0.0–0.1)
Basophils Relative: 1 %
Eosinophils Absolute: 0.3 10*3/uL (ref 0.0–0.5)
Eosinophils Relative: 4 %
HCT: 35.9 % — ABNORMAL LOW (ref 36.0–46.0)
Hemoglobin: 11 g/dL — ABNORMAL LOW (ref 12.0–15.0)
Immature Granulocytes: 1 %
Lymphocytes Relative: 22 %
Lymphs Abs: 1.7 10*3/uL (ref 0.7–4.0)
MCH: 26 pg (ref 26.0–34.0)
MCHC: 30.6 g/dL (ref 30.0–36.0)
MCV: 84.9 fL (ref 80.0–100.0)
Monocytes Absolute: 0.6 10*3/uL (ref 0.1–1.0)
Monocytes Relative: 8 %
Neutro Abs: 4.9 10*3/uL (ref 1.7–7.7)
Neutrophils Relative %: 64 %
Platelets: 270 10*3/uL (ref 150–400)
RBC: 4.23 MIL/uL (ref 3.87–5.11)
RDW: 15.7 % — ABNORMAL HIGH (ref 11.5–15.5)
WBC: 7.5 10*3/uL (ref 4.0–10.5)
nRBC: 0 % (ref 0.0–0.2)

## 2018-11-27 LAB — VANCOMYCIN, TROUGH: Vancomycin Tr: 15 ug/mL (ref 15–20)

## 2018-11-27 LAB — BASIC METABOLIC PANEL
Anion gap: 16 — ABNORMAL HIGH (ref 5–15)
BUN: 16 mg/dL (ref 6–20)
CO2: 20 mmol/L — ABNORMAL LOW (ref 22–32)
Calcium: 9.9 mg/dL (ref 8.9–10.3)
Chloride: 104 mmol/L (ref 98–111)
Creatinine, Ser: 0.92 mg/dL (ref 0.44–1.00)
GFR calc Af Amer: >60 mL/min (ref >60)
GFR calc non Af Amer: >60 mL/min (ref >60)
Glucose, Bld: 101 mg/dL — ABNORMAL HIGH (ref 70–99)
Potassium: 3.3 mmol/L — ABNORMAL LOW (ref 3.5–5.1)
Sodium: 140 mmol/L (ref 135–145)

## 2018-11-29 ENCOUNTER — Encounter (HOSPITAL_BASED_OUTPATIENT_CLINIC_OR_DEPARTMENT_OTHER): Payer: BC Managed Care – PPO | Attending: Physician Assistant | Admitting: Physician Assistant

## 2018-11-29 ENCOUNTER — Other Ambulatory Visit: Payer: Self-pay

## 2018-11-29 DIAGNOSIS — I330 Acute and subacute infective endocarditis: Secondary | ICD-10-CM | POA: Diagnosis not present

## 2018-11-29 DIAGNOSIS — I10 Essential (primary) hypertension: Secondary | ICD-10-CM | POA: Diagnosis not present

## 2018-11-29 DIAGNOSIS — G4730 Sleep apnea, unspecified: Secondary | ICD-10-CM | POA: Insufficient documentation

## 2018-11-29 DIAGNOSIS — Z8614 Personal history of Methicillin resistant Staphylococcus aureus infection: Secondary | ICD-10-CM | POA: Diagnosis not present

## 2018-11-29 DIAGNOSIS — E11622 Type 2 diabetes mellitus with other skin ulcer: Secondary | ICD-10-CM | POA: Diagnosis not present

## 2018-11-29 DIAGNOSIS — L98492 Non-pressure chronic ulcer of skin of other sites with fat layer exposed: Secondary | ICD-10-CM | POA: Diagnosis not present

## 2018-11-30 ENCOUNTER — Encounter: Payer: Self-pay | Admitting: Cardiothoracic Surgery

## 2018-11-30 ENCOUNTER — Ambulatory Visit (INDEPENDENT_AMBULATORY_CARE_PROVIDER_SITE_OTHER): Payer: Self-pay | Admitting: Cardiothoracic Surgery

## 2018-11-30 VITALS — BP 172/96 | HR 80 | Temp 96.6°F | Resp 16 | Ht 67.0 in | Wt 378.0 lb

## 2018-11-30 DIAGNOSIS — Z5189 Encounter for other specified aftercare: Secondary | ICD-10-CM

## 2018-11-30 DIAGNOSIS — M009 Pyogenic arthritis, unspecified: Secondary | ICD-10-CM

## 2018-11-30 DIAGNOSIS — I058 Other rheumatic mitral valve diseases: Secondary | ICD-10-CM

## 2018-11-30 DIAGNOSIS — I059 Rheumatic mitral valve disease, unspecified: Secondary | ICD-10-CM

## 2018-11-30 DIAGNOSIS — B9562 Methicillin resistant Staphylococcus aureus infection as the cause of diseases classified elsewhere: Secondary | ICD-10-CM

## 2018-11-30 DIAGNOSIS — R7881 Bacteremia: Secondary | ICD-10-CM

## 2018-11-30 NOTE — Progress Notes (Signed)
Tiffany Bullock, Tiffany Bullock ([id number]) Visit Report for 11/29/2018 Arrival Information Details Patient Name: Date of Service: Tiffany Bullock, Tiffany Bullock 11/29/2018 7:30 AM Medical Record [id number] Patient Account Number: [id number] Date of Birth/Sex: Treating RN: [birth year] (58 y.o. [name], [name] Primary Care Lynanne Delgreco: [name] Other Clinician: Sandre Kitty Referring Azarie Coriz: Treating Jamika Sadek/Extender:Stone III, Eustace Pen, CLAIRE Weeks in Treatment: 2 Visit Information History Since Last Visit Added or deleted any medications: No Patient Arrived: Ambulatory Any new allergies or adverse reactions: No Arrival Time: 07:57 Had a fall or experienced change in No Accompanied By: self activities of daily living that may affect Transfer Assistance: None risk of falls: Patient Identification Verified: Yes Signs or symptoms of abuse/neglect since last No Secondary Verification Process Yes visito Completed: Hospitalized since last visit: No Patient Requires Transmission-Based No Implantable device outside of the clinic excluding No Precautions: cellular tissue based products placed in the center Patient Has Alerts: No since last visit: Has Dressing in Place as Prescribed: Yes Pain Present Now: Yes Electronic Signature(s) Signed: 11/29/2018 6:07:42 PM By: Deon Pilling Entered By: Deon Pilling on 11/29/2018 07:57:56 -------------------------------------------------------------------------------- Lower Extremity Assessment Details Patient Name: Date of Service: Tiffany Bullock, Tiffany Bullock 11/29/2018 7:30 AM Medical Record [id number] Patient Account Number: [id number] Date of Birth/Sex: Treating RN: 12-23-1960 (58 y.o. [name] Primary Care Delta Pichon: [name] Other Clinician: Sandre Kitty Referring Harvis Mabus: Treating Marcianna Daily/Extender:Stone III, Eustace Pen, CLAIRE Weeks in Treatment: 2 Electronic Signature(s) Signed: 11/29/2018 6:07:42 PM By: Deon Pilling Entered By: Deon Pilling on 11/29/2018 07:58:38 -------------------------------------------------------------------------------- [place] Details Patient Name: Date of Service: Tiffany Bullock, Tiffany Bullock 11/29/2018 7:30 AM Medical Record [id number] Patient Account Number: [id number] Date of Birth/Sex: Treating RN: 05-Jan-1961 (58 y.o. Elam Dutch Primary Care Vaibhav Fogleman: [name] Other Clinician: Sandre Kitty Referring Lysle Yero: Treating Vianne Grieshop/Extender:Stone III, Eustace Pen, CLAIRE Weeks in Treatment: 2 Active Inactive Wound/Skin Impairment Nursing Diagnoses: Impaired tissue integrity Knowledge deficit related to ulceration/compromised skin integrity Goals: Patient/caregiver will verbalize understanding of skin care regimen Date Initiated: 11/15/2018 Target Resolution Date: 12/13/2018 Goal Status: Active Ulcer/skin breakdown will have a volume reduction of 30% by week 4 Date Initiated: 11/15/2018 Target Resolution Date: 12/13/2018 Goal Status: Active Interventions: Assess patient/caregiver ability to obtain necessary supplies Assess patient/caregiver ability to perform ulcer/skin care regimen upon admission and as needed Assess ulceration(s) every visit Provide education on ulcer and skin care Treatment Activities: Skin care regimen initiated : 11/15/2018 Topical wound management initiated : 11/15/2018 Notes: Electronic Signature(s) Signed: 11/30/2018 12:09:12 PM By: Baruch Gouty RN, BSN Entered By: Baruch Gouty on 11/29/2018 08:25:37 -------------------------------------------------------------------------------- Pain Assessment Details Patient Name: Date of Service: Tiffany Bullock, Tiffany Bullock 11/29/2018 7:30 AM Medical Record [id number] Patient Account Number: [id number] Date of Birth/Sex: Treating RN: 02/15/1961 (58 y.o. [name] Primary Care Yoselin Amerman: [name] Other Clinician: Sandre Kitty Referring Tyrick Dunagan:  Treating Jancie Kercher/Extender:Stone III, Eustace Pen, CLAIRE Weeks in Treatment: 2 Active Problems Location of Pain Severity and Description of Pain Patient Has Paino Yes Site Locations Pain Location: Generalized Pain Rate the pain. Current Pain Level: 6 Worst Pain Level: 10 Least Pain Level: 0 Tolerable Pain Level: 7 Pain Management and Medication Current Pain Management: Medication: No Cold Application: No Rest: No Massage: No Activity: No T.E.N.S.: No Heat Application: No Leg drop or elevation: No Is the Current Pain Management Adequate: Adequate How does your wound impact your activities of daily livingo Sleep: No Bathing: No Appetite: No Relationship With Others: No Bladder Continence: No Emotions: No Bowel Continence: No Work: No Toileting: No Drive: No Dressing: No Hobbies: No Electronic  Signature(s) Signed: 11/29/2018 6:07:42 PM By: Deon Pilling Entered By: Deon Pilling on 11/29/2018 07:58:33 -------------------------------------------------------------------------------- Patient/Caregiver Education Details Patient Name: Date of Service: Tiffany Bullock 10/7/2020andnbsp7:30 AM Medical Record Patient Account Number: [id number] Number: Treating RN: Baruch Gouty Date of Birth/Gender: [birth year] (58 y.o. F) Other Clinician: Sandre Kitty Primary Care Physician: [name] Treating Stone III, Katheran James Referring Physician: Physician/Extender: Michelene Gardener in Treatment: 2 Education Assessment Education Provided To: Patient Education Topics Provided Infection: Methods: Explain/Verbal Responses: Reinforcements needed, State content correctly Wound/Skin Impairment: Methods: Explain/Verbal Responses: Reinforcements needed, State content correctly Electronic Signature(s) Signed: 11/30/2018 12:09:12 PM By: Baruch Gouty RN, BSN Entered By: Baruch Gouty on 11/29/2018  08:26:02 -------------------------------------------------------------------------------- Wound Assessment Details Patient Name: Date of Service: Tiffany Bullock, Tiffany Bullock 11/29/2018 7:30 AM Medical Record [id number] Patient Account Number: [id number] Date of Birth/Sex: Treating RN: [birth year] (58 y.o. [name], [name] Primary Care Fox Salminen: [name] Other Clinician: Sandre Kitty Referring Contessa Preuss: Treating Jasn Xia/Extender:Stone III, Eustace Pen, CLAIRE Weeks in Treatment: 2 Wound Status Wound Number: 1 Primary Abscess Etiology: Wound Location: Sternum - Proximal Wound Open Wounding Event: Surgical Injury Status: Date Acquired: 10/24/2018 Comorbid Cataracts, Sleep Apnea, Hypertension, Weeks Of Treatment: 2 History: Type II Diabetes Clustered Wound: Yes Photos Wound Measurements Length: (cm) 0.2 % Reduction Width: (cm) 2 % Redu Depth: (cm) 0.1 Epithe Clustered Quantity: 2 Tunnel Area: (cm) 0.314 Under Volume: (cm) 0.031 Wound Description Classification: Full Thickness Without Exposed Support Structures Wound Flat and Intact Margin: Exudate Small Amount: Exudate Serosanguineous Type: Exudate red, brown Color: Wound Bed Granulation Amount: Large (67-100%) Granulation Quality: Red, Hyper-granulation Necrotic Amount: None Present (0%) Foul Odor After Cleansing: No Slough/Fibrino No Exposed Structure Fascia Exposed: No Fat Layer (Subcutaneous Tissue) Exposed: Yes Tendon Exposed: No Muscle Exposed: No Joint Exposed: No Bone Exposed: No in Area: 90.4% ction in Volume: 90.5% lialization: Large (67-100%) ing: No mining: No Assessment Notes island of epithelium noted in middle of wound creating a cluster of 2 now. Electronic Signature(s) Signed: 11/30/2018 1:38:24 PM By: Mikeal Hawthorne EMT/HBOT Signed: 11/30/2018 6:06:48 PM By: Deon Pilling Previous Signature: 11/29/2018 6:07:42 PM Version By: Deon Pilling Entered By: Mikeal Hawthorne on 11/30/2018  10:20:24 -------------------------------------------------------------------------------- Vitals Details Patient Name: Date of Service: Tiffany Bullock, Tiffany Bullock 11/29/2018 7:30 AM Medical Record [id number] Patient Account Number: [id number] Date of Birth/Sex: Treating RN: 1961-01-01 (58 y.o. [name], [name] Primary Care Liliana Dang: [name] Other Clinician: Sandre Kitty Referring Devonda Pequignot: Treating Zorian Gunderman/Extender:Stone III, Eustace Pen, CLAIRE Weeks in Treatment: 2 Vital Signs Time Taken: 07:48 Temperature (F): 98.7 Height (in): 67 Pulse (bpm): 74 Weight (lbs): 370 Respiratory Rate (breaths/min): 20 Body Mass Index (BMI): 57.9 Blood Pressure (mmHg): 148/80 Reference Range: 80 - 120 mg / dl Electronic Signature(s) Signed: 11/29/2018 6:07:42 PM By: Deon Pilling Entered By: Deon Pilling on 11/29/2018 07:58:10

## 2018-11-30 NOTE — Progress Notes (Signed)
[address]       [place] [address]             [phone]      [name] [facility] Medical Record #[id number] Date of Birth: 1960/06/24  Referring: [name],* Primary Care: [name], MD Primary Cardiologist: No primary care provider on file.   Chief Complaint:   POST OP FOLLOW UP OPERATIVE REPORT DATE OF PROCEDURE:  10/25/2018 PREOPERATIVE DIAGNOSIS:  Bacteremia with probable sternoclavicular abscess. 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 PROCEDURE PERFORMED: 1.  Incision, drainage, irrigation, and debridement of right sternoclavicular joint with placement of wound VAC. 2.  TEE done concomitantly by Dr. [name].  Findings dictated under a separate note.\  History of Present Illness:      Patient continues with local wound care of her right sternoclavicular joint, the area is now almost completely healed with just a very superficial area that septated is been, she has no joint pain She continues on IV antibiotic therapy, her dose of vancomycin has been increased because of low levels.  She is scheduled to complete her IV antibiotics on October 14.    Past Medical History:  Diagnosis Date  . Anemia   . Blood transfusion without reported diagnosis Age 50  . Endocarditis of mitral valve    with MRSA, tx'ed in 05/345 complicated by septic PEs and RT [place] septic arthritis  . Essential hypertension 10/24/2018  . Obesity (BMI 35.0-39.9 without comorbidity)   . Personal history of colonic polyp - adenoma 11/05/2013  . Sleep apnea    uses cpap     Social History   Tobacco Use  Smoking Status Never Smoker  Smokeless Tobacco Never Used    Social History   Substance and Sexual Activity  Alcohol Use Yes  . Alcohol/week: 1.0 standard drinks  . Types: 1 Standard drinks or equivalent per week     No Known Allergies  Current Outpatient Medications   Medication Sig Dispense Refill  . cholecalciferol (VITAMIN D3) 25 MCG (1000 UT) tablet Take 1,000 Units by mouth daily.    . ferrous sulfate 325 (65 FE) MG tablet Take 325 mg by mouth daily with breakfast.    . gabapentin (NEURONTIN) 300 MG capsule Take 1 capsule (300 mg total) by mouth daily as needed. 30 capsule 0  . metFORMIN (GLUCOPHAGE) 500 MG tablet Take 1 tablet by mouth daily.    . valsartan-hydrochlorothiazide (DIOVAN-HCT) 320-25 MG tablet Take 1 tablet by mouth daily.    . vancomycin IVPB Inject 1,750 mg into the vein daily. Indication:  Septic arthritis of [place] joint, MRSA bacteremia and endocarditis Last Day of Therapy:  12/06/18 Labs - Sunday/Monday:  CBC/D, BMP, and vancomycin trough. Labs - Thursday:  BMP and vancomycin trough Labs - Every other week:  ESR and CRP 30 Units 0   No current facility-administered medications for this visit.        Physical Exam: BP (!) 172/96 (BP Location: Right Arm, Patient Position: Sitting, Cuff Size: Large)   Pulse 80   Temp (!) 96.6 F (35.9 C)   Resp 16   Ht '5\' 7"'  (1.702 m)   Wt (!) 378 lb (171.5 kg)   LMP 07/24/2011   SpO2 96% Comment: RA  BMI 59.20 kg/m   General appearance: alert and cooperative Neurologic: intact Heart: regular rate and rhythm, S1, S2 normal, no murmur, click, rub or gallop Lungs: clear to auscultation bilaterally Abdomen:  soft, non-tender; bowel sounds normal; no masses,  no organomegaly Extremities: extremities normal, atraumatic, no cyanosis or edema and Homans sign is negative, no sign of DVT Wound: Right supraclavicular sternoclavicular joint incision is now almost completely healed without evidence of infection   Diagnostic Studies & Laboratory data:     Recent Radiology Findings:   No results found.    Recent Lab Findings: Lab Results  Component Value Date   WBC 7.5 11/27/2018   HGB 11.0 (L) 11/27/2018   HCT 35.9 (L) 11/27/2018   PLT 270 11/27/2018   GLUCOSE 101 (H) 11/27/2018   CHOL  221 (H) 11/09/2012   TRIG 56 11/09/2012   HDL 77 11/09/2012   LDLCALC 133 (H) 11/09/2012   ALT 22 10/25/2018   AST 24 10/25/2018   NA 140 11/27/2018   K 3.3 (L) 11/27/2018   CL 104 11/27/2018   CREATININE 0.92 11/27/2018   BUN 16 11/27/2018   CO2 20 (L) 11/27/2018   INR 1.2 10/25/2018   HGBA1C 6.3 (H) 10/24/2018   TEE done at the time of surgery Patient Name:   Tiffany Bullock Date of Exam: 10/25/2018 Medical Rec #:  [id number]         Height:       67.0 in Accession #:    [id number]        Weight:       381.4 lb Date of Birth:  10/21/1960          BSA:          2.66 m Patient Age:    58 years          BP:           141/75 mmHg Patient Gender: F                 HR:           77 bpm. Exam Location:  Inpatient  Transesophogeal exam was perform intraoperatively during surgical procedure. Patient was closely monitored under general anesthesia during the entirety of examination.  Indications:     Bacteremia History:         Signs/Symptoms: Bacteremia Risk Factors: Hypertension. Sonographer:     Roseanna Rainbow Performing Phys: [place] Diagnosing Phys: Adrian Prows MD  Complications: No known complications during this procedure.                              Final interpretation:     Left Ventricle: The left ventricle has normal systolic function, with an ejection fraction of 60-65%. The cavity size was normal. There is no increase in                         left ventricular wall thickness.  Mitral Valve: The mitral valve is normal in structure. Mild thickening of the    mitral valve leaflet. Mitral valve regurgitation is trivial by color flow  Doppler. A small non-mobile vegetation is seen on the anterior mitral leaflet.  Tricuspid Valve: The tricuspid valve was normal in structure. Tricuspid valve    regurgitation is trivial by color flow Doppler. A small vegetation on the                      lateral TV leaflet cannot be excluded.  Left Ventricle: The left ventricle has normal  systolic function, with an ejection fraction of 60-65%. The cavity  size was normal. There is no increase in left ventricular wall thickness.  Right Ventricle: The right ventricle has normal systolic function. The cavity was normal. There is no increase in right ventricular wall thickness. Right ventricular systolic pressure is mildly elevated.  Left Atrium: Left atrial size was normal in size.  Right Atrium: Right atrial size was normal in size. Right atrial pressure is estimated at 10 mmHg.  Interatrial Septum: No atrial level shunt detected by color flow Doppler.  Pericardium: There is no evidence of pericardial effusion.  Mitral Valve: The mitral valve is normal in structure. Mild thickening of the mitral valve leaflet. Mitral valve regurgitation is trivial by color flow Doppler. A small non-mobile vegetation is seen on the anterior mitral leaflet.  Tricuspid Valve: The tricuspid valve was normal in structure. Tricuspid valve regurgitation is trivial by color flow Doppler. A small vegetation on the lateral TV leaflet cannot be excluded.  Aortic Valve: The aortic valve is normal in structure. Aortic valve regurgitation was not assessed by color flow Doppler.  Pulmonic Valve: The pulmonic valve was normal in structure, with normal. The gradient recorded across the pulmonic valve is within the expected range. The gradient recorded across the prosthetic pulmonic valve is within the expected range. Pulmonic valve regurgitation was not assessed by color flow Doppler.   Venous: The inferior vena cava is normal in size with greater than 50% respiratory variability.    Adrian Prows MD Electronically signed by Adrian Prows MD Signature Date/Time: 10/25/2018/6:24:32 PM     Assessment / Plan:   History of infected right sternoclavicular joint with positive blood cultures Treated for 6 weeks of IV antibiotics for presumed mitral valve endocarditis with very small nonmobile vegetation seen  on the anterior leaflet  The patient sternoclavicular joint is almost completely healed, we will see her back in the surgical office as needed She will contact infectious disease office for removal of her PICC line at the completion of IV therapy She will make a follow-up appointment with Dr. [name], for consideration of follow-up echocardiogram after treatment for endocarditis   Medication Changes: No orders of the defined types were placed in this encounter.     Grace Isaac MD      [address] [place] [address] Office [phone]   Beeper [phone]  11/30/2018 1:51 PM

## 2018-12-01 ENCOUNTER — Other Ambulatory Visit (HOSPITAL_COMMUNITY)
Admission: RE | Admit: 2018-12-01 | Discharge: 2018-12-01 | Disposition: A | Discharge status: Home / Self Care | Payer: BC Managed Care – PPO | Source: Other Acute Inpatient Hospital | Attending: Infectious Diseases | Admitting: Infectious Diseases

## 2018-12-01 DIAGNOSIS — A4102 Sepsis due to Methicillin resistant Staphylococcus aureus: Secondary | ICD-10-CM | POA: Insufficient documentation

## 2018-12-01 LAB — BASIC METABOLIC PANEL
Anion gap: 12 (ref 5–15)
BUN: 21 mg/dL — ABNORMAL HIGH (ref 6–20)
CO2: 25 mmol/L (ref 22–32)
Calcium: 9.7 mg/dL (ref 8.9–10.3)
Chloride: 102 mmol/L (ref 98–111)
Creatinine, Ser: 1.15 mg/dL — ABNORMAL HIGH (ref 0.44–1.00)
GFR calc Af Amer: >60 mL/min (ref >60)
GFR calc non Af Amer: 53 mL/min — ABNORMAL LOW (ref >60)
Glucose, Bld: 94 mg/dL (ref 70–99)
Potassium: 3.3 mmol/L — ABNORMAL LOW (ref 3.5–5.1)
Sodium: 139 mmol/L (ref 135–145)

## 2018-12-01 LAB — VANCOMYCIN, TROUGH: Vancomycin Tr: 14 ug/mL — ABNORMAL LOW (ref 15–20)

## 2018-12-03 NOTE — Progress Notes (Signed)
Tiffany Bullock, Tiffany Bullock ([id number]) Visit Report for 11/29/2018 Chief Complaint Document Details Patient Name: Date of Service: Tiffany Bullock, Tiffany Bullock 11/29/2018 7:30 AM Medical Record [id number] Patient Account Number: [id number] Date of Birth/Sex: Treating RN: 1960-12-09 (58 y.o. Elam Dutch Primary Care Provider: [name] Other Clinician: Sandre Kitty Referring Provider: Treating Provider/Extender:Stone III, Eustace Pen, CLAIRE Weeks in Treatment: 2 Information Obtained from: Patient Chief Complaint Chest wall abscess/ulcer Electronic Signature(s) Signed: 12/03/2018 10:37:03 PM By: Worthy Keeler PA-C Entered By: Worthy Keeler on 11/29/2018 08:00:14 -------------------------------------------------------------------------------- HPI Details Patient Name: Date of Service: Tiffany Bullock, Tiffany Bullock 11/29/2018 7:30 AM Medical Record [id number] Patient Account Number: [id number] Date of Birth/Sex: Treating RN: 02/15/1961 (58 y.o. Elam Dutch Primary Care Provider: [name] Other Clinician: Sandre Kitty Referring Provider: Treating Provider/Extender:Stone III, Eustace Pen, CLAIRE Weeks in Treatment: 2 History of Present Illness HPI Description: 11/15/2018 upon evaluation today patient presents here in our clinic due to an issue that she has had that began on August 31. She is really not sure what exactly started first but nonetheless on August 31 she noted an abscess on her chest just below her Adam's apple area on the upper portion of her sternum. Subsequently she ended up going to the hospital on the first and ended up with an incision and drainage on the second at the site. She also did have blood cultures which were positive for MRSA. Subsequently she was placed on IV vancomycin. 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 Overall she seems to be doing much better at this  point she is seeing infectious disease at this point still and she is still on IV antibiotic therapy through home health. Fortunately there is no signs of active infection systemically given her trouble at this time which is good news. The wound itself also seems to be healing and doing great. 11/22/2018 on evaluation today patient appears to be doing well with regard to her wound. She has been tolerating the dressing changes without complication. Fortunately there is no signs of active infection. With regard to the measurements this is measuring significantly smaller compared to last week. Overall I am extremely pleased with the amount of progress that she is made in just 1 week's time. 11/29/2018 on evaluation today patient appears to be doing very well with regard to the wound on her sternal region. The good news is this is showing signs of excellent improvement it has actually divided into 2 with a skin island between on the right side of her body she actually does have hyper granulation unfortunately that I think we are getting need to address today. Other than that overall things seem to be doing excellent. Electronic Signature(s) Signed: 11/29/2018 8:30:26 AM By: Worthy Keeler PA-C Entered By: Worthy Keeler on 11/29/2018 08:30:26 -------------------------------------------------------------------------------- Chemical Cauterization Details Patient Name: Date of Service: Tiffany Bullock, Tiffany Bullock 11/29/2018 7:30 AM Medical Record [id number] Patient Account Number: [id number] Date of Birth/Sex: Treating RN: 30-Jul-1960 (58 y.o. Elam Dutch Primary Care Provider: [name] Other Clinician: Sandre Kitty Referring Provider: Treating Provider/Extender:Stone III, Eustace Pen, CLAIRE Weeks in Treatment: 2 Procedure Performed for: Wound #1 Proximal Sternum Performed By: Physician Worthy Keeler, PA Post Procedure Diagnosis Same as Pre-procedure Notes used 1 silver nitrate  stick Electronic Signature(s) Signed: 11/30/2018 12:09:12 PM By: Baruch Gouty RN, BSN Signed: 12/03/2018 10:37:03 PM By: Worthy Keeler PA-C Entered By: Baruch Gouty on 11/29/2018 08:27:49 -------------------------------------------------------------------------------- Physical Exam Details Patient  Name: Date of Service: Tiffany Bullock, Tiffany Bullock 11/29/2018 7:30 AM Medical Record [id number] Patient Account Number: [id number] Date of Birth/Sex: Treating RN: December 27, 1960 (58 y.o. Elam Dutch Primary Care Provider: [name] Other Clinician: Sandre Kitty Referring Provider: Treating Provider/Extender:Stone III, Eustace Pen, CLAIRE Weeks in Treatment: 2 Constitutional Well-nourished and well-hydrated in no acute distress. Respiratory normal breathing without difficulty. Psychiatric this patient is able to make decisions and demonstrates good insight into disease process. Alert and Oriented x 3. pleasant and cooperative. Notes Patient's wound bed on evaluation today appears to be doing quite well which is good news. She does have some hyper granulation is can require management today I did chemically cauterize the hyper granular region today with 1 stick of silver nitrate she tolerated that without complication post debridement wound bed appears to be doing much better which is excellent news. Electronic Signature(s) Signed: 12/03/2018 10:37:03 PM By: Worthy Keeler PA-C Entered By: Worthy Keeler on 11/29/2018 08:30:53 -------------------------------------------------------------------------------- Physician Orders Details Patient Name: Date of Service: Tiffany Bullock, Tiffany Bullock 11/29/2018 7:30 AM Medical Record [id number] Patient Account Number: [id number] Date of Birth/Sex: Treating RN: 08-23-1960 (58 y.o. Elam Dutch Primary Care Provider: [name] Other Clinician: Sandre Kitty Referring Provider: Treating Provider/Extender:Stone III,  Eustace Pen, CLAIRE Weeks in Treatment: 2 Verbal / Phone Orders: No Diagnosis Coding ICD-10 Coding Code Description L02.213 Cutaneous abscess of chest wall L98.492 Non-pressure chronic ulcer of skin of other sites with fat layer exposed E11.622 Type 2 diabetes mellitus with other skin ulcer I10 Essential (primary) hypertension I33.0 Acute and subacute infective endocarditis Follow-up Appointments Return Appointment in 1 week. Dressing Change Frequency Wound #1 Proximal Sternum Change Dressing every other day. Wound Cleansing Wound #1 Proximal Sternum May shower and wash wound with soap and water. - with dressing changes using dial antibacterial soap Primary Wound Dressing Wound #1 Proximal Sternum Hydrofera Blue - ready Secondary Dressing Wound #1 Proximal Sternum Foam Border - or large waterproof bandaid such as Building control surveyor) Signed: 11/30/2018 12:09:12 PM By: Baruch Gouty RN, BSN Signed: 12/03/2018 10:37:03 PM By: Worthy Keeler PA-C Entered By: Baruch Gouty on 11/29/2018 [id number] -------------------------------------------------------------------------------- Problem List Details Patient Name: Date of Service: Tiffany Bullock, Tiffany Bullock 11/29/2018 7:30 AM Medical Record [id number] Patient Account Number: [id number] Date of Birth/Sex: Treating RN: 1960/06/09 (58 y.o. Elam Dutch Primary Care Provider: [name] Other Clinician: Sandre Kitty Referring Provider: Treating Provider/Extender:Stone III, Eustace Pen, CLAIRE Weeks in Treatment: 2 Active Problems ICD-10 Evaluated Encounter Code Description Active Date Today Diagnosis L02.213 Cutaneous abscess of chest wall 11/15/2018 No Yes L98.492 Non-pressure chronic ulcer of skin of other sites with 11/15/2018 No Yes fat layer exposed E11.622 Type 2 diabetes mellitus with other skin ulcer 11/15/2018 No Yes I10 Essential (primary) hypertension 11/15/2018 No Yes I33.0 Acute and subacute  infective endocarditis 11/15/2018 No Yes Inactive Problems Resolved Problems Electronic Signature(s) Signed: 12/03/2018 10:37:03 PM By: Worthy Keeler PA-C Entered By: Worthy Keeler on 11/29/2018 07:59:55 -------------------------------------------------------------------------------- Progress Note Details Patient Name: Date of Service: Tiffany Bullock, Tiffany Bullock 11/29/2018 7:30 AM Medical Record [id number] Patient Account Number: [id number] Date of Birth/Sex: Treating RN: 04-08-1960 (58 y.o. Elam Dutch Primary Care Provider: [name] Other Clinician: Sandre Kitty Referring Provider: Treating Provider/Extender:Stone III, Eustace Pen, CLAIRE Weeks in Treatment: 2 Subjective Chief Complaint Information obtained from Patient Chest wall abscess/ulcer History of Present Illness (HPI) 11/15/2018 upon evaluation today patient presents here in our clinic due to an issue that she has had that began on August 31. She is really not  sure what exactly started first but nonetheless on August 31 she noted an abscess on her chest just below her Adam's apple area on the upper portion of her sternum. Subsequently she ended up going to the hospital on the first and ended up with an incision and drainage on the second at the site. She also did have blood cultures which were positive for MRSA. Subsequently she was placed on IV vancomycin. 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 Overall she seems to be doing much better at this point she is seeing infectious disease at this point still and she is still on IV antibiotic therapy through home health. Fortunately there is no signs of active infection systemically given her trouble at this time which is good news. The wound itself also seems to be healing and doing great. 11/22/2018 on evaluation today patient appears to be doing well with regard to her wound. She  has been tolerating the dressing changes without complication. Fortunately there is no signs of active infection. With regard to the measurements this is measuring significantly smaller compared to last week. Overall I am extremely pleased with the amount of progress that she is made in just 1 week's time. 11/29/2018 on evaluation today patient appears to be doing very well with regard to the wound on her sternal region. The good news is this is showing signs of excellent improvement it has actually divided into 2 with a skin island between on the right side of her body she actually does have hyper granulation unfortunately that I think we are getting need to address today. Other than that overall things seem to be doing excellent. Patient History Information obtained from Patient. Family History Unknown History. Social History Never smoker, Marital Status - Single, Alcohol Use - Never, Drug Use - No History, Caffeine Use - Rarely. Medical History Eyes Patient has history of Cataracts - both eyes Denies history of Glaucoma, Optic Neuritis Respiratory Patient has history of Sleep Apnea Denies history of Aspiration, Asthma, Chronic Obstructive Pulmonary Disease (COPD), Pneumothorax, Tuberculosis Cardiovascular Patient has history of Hypertension Denies history of Angina, Arrhythmia, Congestive Heart Failure, Coronary Artery Disease, Deep Vein Thrombosis, Hypotension, Myocardial Infarction, Peripheral Arterial Disease, Peripheral Venous Disease, Phlebitis, Vasculitis Endocrine Patient has history of Type II Diabetes Denies history of Type I Diabetes Medical And Surgical History Notes Cardiovascular Endocarditis of mitral valve Musculoskeletal Septic arthritis in right shoulder Review of Systems (ROS) Constitutional Symptoms (General Health) Denies complaints or symptoms of Fatigue, Fever, Chills, Marked Weight Change. Respiratory Denies complaints or symptoms of Chronic or frequent  coughs, Shortness of Breath. Cardiovascular Denies complaints or symptoms of Chest pain. Psychiatric Denies complaints or symptoms of Claustrophobia, Suicidal. Objective Constitutional Well-nourished and well-hydrated in no acute distress. Vitals Time Taken: 7:48 AM, Height: 67 in, Weight: 370 lbs, BMI: 57.9, Temperature: 98.7 F, Pulse: 74 bpm, Respiratory Rate: 20 breaths/min, Blood Pressure: 148/80 mmHg. Respiratory normal breathing without difficulty. Psychiatric this patient is able to make decisions and demonstrates good insight into disease process. Alert and Oriented x 3. pleasant and cooperative. General Notes: Patient's wound bed on evaluation today appears to be doing quite well which is good news. She does have some hyper granulation is can require management today I did chemically cauterize the hyper granular region today with 1 stick of silver nitrate she tolerated that without complication post debridement wound bed appears to be doing much better which is excellent news.  Integumentary (Hair, Skin) Wound #1 status is Open. Original cause of wound was Surgical Injury. The wound is located on the Proximal Sternum. The wound measures 0.2cm length x 2cm width x 0.1cm depth; 0.314cm^2 area and 0.031cm^3 volume. There is Fat Layer (Subcutaneous Tissue) Exposed exposed. There is no tunneling or undermining noted. There is a small amount of serosanguineous drainage noted. The wound margin is flat and intact. There is large (67-100%) red, hyper - granulation within the wound bed. There is no necrotic tissue within the wound bed. General Notes: island of epithelium noted in middle of wound creating a cluster of 2 now. 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 An Chemical Cauterization procedure was performed by Worthy Keeler, PA. Post procedure Diagnosis Wound #1: Same as Pre-Procedure Notes: used 1 silver nitrate stick Plan Follow-up Appointments: Return Appointment in 1 week. Dressing Change Frequency: Wound #1 Proximal Sternum: Change Dressing every other day. Wound Cleansing: Wound #1 Proximal Sternum: May shower and wash wound with soap and water. - with dressing changes using dial antibacterial soap Primary Wound Dressing: Wound #1 Proximal Sternum: Hydrofera Blue - ready Secondary Dressing: Wound #1 Proximal Sternum: Foam Border - or large waterproof bandaid such as [employer]care 1. I would recommend at this time that we go ahead and initiate treatment with a Hydrofera Blue dressing which I think is good to be beneficial for the patient. 2. I am also going to suggest that we go ahead and have her change this every other day which I think will be appropriate hopefully with any luck this will be completely healed by next week. 3. As far as her antibiotics are concerned they have doubled her antibiotic dosing and hopefully that should be up to a good level as far as the vancomycin is concerned although she is awaiting the lab work on that. We will see patient back for reevaluation in 1 week here in the clinic. If anything worsens or changes patient will contact our office for additional recommendations. Electronic Signature(s) Signed: 12/03/2018 10:37:03 PM By: Worthy Keeler PA-C Entered By: Worthy Keeler on 11/29/2018 08:31:45 -------------------------------------------------------------------------------- HxROS Details Patient Name: Date of Service: Tiffany Bullock, Tiffany Bullock 11/29/2018 7:30 AM Medical Record [id number] Patient Account Number: [id number] Date of Birth/Sex: Treating RN: Jun 11, 1960 (58 y.o. Elam Dutch Primary Care Provider: [name] Other Clinician: Sandre Kitty Referring Provider: Treating Provider/Extender:Stone III, Eustace Pen, CLAIRE Weeks in Treatment: 2 Information Obtained From Patient Constitutional Symptoms (General Health) Complaints and Symptoms: Negative for: Fatigue; Fever; Chills; Marked Weight Change Respiratory Complaints and Symptoms: Negative for: Chronic or frequent coughs; Shortness of Breath Medical History: Positive for: Sleep Apnea Negative for: Aspiration; Asthma; Chronic Obstructive Pulmonary Disease (COPD); Pneumothorax; Tuberculosis Cardiovascular Complaints and Symptoms: Negative for: Chest pain Medical History: Positive for: Hypertension Negative for: Angina; Arrhythmia; Congestive Heart Failure; Coronary Artery Disease; Deep Vein Thrombosis; Hypotension; Myocardial Infarction; Peripheral Arterial Disease; Peripheral Venous Disease; Phlebitis; Vasculitis Past Medical History Notes: Endocarditis of mitral valve Psychiatric Complaints and Symptoms: Negative for: Claustrophobia; Suicidal Eyes Medical History: Positive for: Cataracts - both eyes Negative for: Glaucoma; Optic Neuritis Endocrine Medical History: Positive for: Type II Diabetes Negative for: Type I Diabetes Musculoskeletal Medical History: Past Medical History Notes: Septic  arthritis in right shoulder HBO Extended History Items Eyes: Cataracts Immunizations Pneumococcal Vaccine: Received Pneumococcal Vaccination: No Implantable Devices None Family and Social History Unknown History: Yes; Never smoker; Marital Status - Single; Alcohol Use: Never; Drug Use: No History; Caffeine Use: Rarely; Financial Concerns: No; Food, Clothing or Shelter Needs: No; Support System Lacking: No; Transportation Concerns: No Physician Affirmation I have reviewed and agree with the above information. Electronic Signature(s) Signed: 11/30/2018 12:09:12 PM By: Baruch Gouty RN, BSN Signed: 12/03/2018 10:37:03 PM By: Worthy Keeler PA-C Entered By: Worthy Keeler on 11/29/2018 08:30:43 -------------------------------------------------------------------------------- SuperBill Details Patient Name: Date of Service: Tiffany Bullock, Tiffany Bullock 11/29/2018 Medical Record [id number] Patient Account Number: [id number] Date of Birth/Sex: Treating RN: 1960-03-08 (58 y.o. Elam Dutch Primary Care Provider: [name] Other Clinician: Sandre Kitty Referring Provider: Treating Provider/Extender:Stone III, Eustace Pen, CLAIRE Weeks in Treatment: 2 Diagnosis Coding ICD-10 Codes Code Description L02.213 Cutaneous abscess of chest wall L98.492 Non-pressure chronic ulcer of skin of other sites with fat layer exposed E11.622 Type 2 diabetes mellitus with other skin ulcer I10 Essential (primary) hypertension I33.0 Acute and subacute infective endocarditis Facility Procedures CPT4 Code Description: [id number] - CHEM CAUT GRANULATION TISS ICD-10 Diagnosis Description L98.492 Non-pressure chronic ulcer of skin of other sites with Modifier: fat layer expo Quantity: 1 sed Physician Procedures CPT4 Code Description: [id number] - WC PHYS CHEM CAUT GRAN TISSUE ICD-10 Diagnosis Description L98.492 Non-pressure chronic ulcer of skin of other sites with f Modifier: at layer expos Quantity: 1 ed Electronic Signature(s) Signed: 12/03/2018 10:37:03 PM By: Worthy Keeler PA-C Entered By: Worthy Keeler on 11/29/2018 08:31:52

## 2018-12-04 ENCOUNTER — Other Ambulatory Visit (HOSPITAL_COMMUNITY)
Admission: RE | Admit: 2018-12-04 | Discharge: 2018-12-04 | Disposition: A | Discharge status: Home / Self Care | Payer: BC Managed Care – PPO | Source: Other Acute Inpatient Hospital | Attending: Infectious Diseases | Admitting: Infectious Diseases

## 2018-12-04 DIAGNOSIS — A4102 Sepsis due to Methicillin resistant Staphylococcus aureus: Secondary | ICD-10-CM | POA: Insufficient documentation

## 2018-12-04 LAB — VANCOMYCIN, TROUGH: Vancomycin Tr: 16 ug/mL (ref 15–20)

## 2018-12-04 LAB — CBC WITH DIFFERENTIAL/PLATELET
Abs Immature Granulocytes: 0.02 10*3/uL (ref 0.00–0.07)
Basophils Absolute: 0.1 10*3/uL (ref 0.0–0.1)
Basophils Relative: 1 %
Eosinophils Absolute: 0.2 10*3/uL (ref 0.0–0.5)
Eosinophils Relative: 2 %
HCT: 38.1 % (ref 36.0–46.0)
Hemoglobin: 11.7 g/dL — ABNORMAL LOW (ref 12.0–15.0)
Immature Granulocytes: 0 %
Lymphocytes Relative: 21 %
Lymphs Abs: 1.6 10*3/uL (ref 0.7–4.0)
MCH: 26.2 pg (ref 26.0–34.0)
MCHC: 30.7 g/dL (ref 30.0–36.0)
MCV: 85.4 fL (ref 80.0–100.0)
Monocytes Absolute: 0.8 10*3/uL (ref 0.1–1.0)
Monocytes Relative: 11 %
Neutro Abs: 5.1 10*3/uL (ref 1.7–7.7)
Neutrophils Relative %: 65 %
Platelets: 309 10*3/uL (ref 150–400)
RBC: 4.46 MIL/uL (ref 3.87–5.11)
RDW: 15.5 % (ref 11.5–15.5)
WBC: 7.7 10*3/uL (ref 4.0–10.5)
nRBC: 0 % (ref 0.0–0.2)

## 2018-12-04 LAB — BASIC METABOLIC PANEL
Anion gap: 13 (ref 5–15)
BUN: 19 mg/dL (ref 6–20)
CO2: 25 mmol/L (ref 22–32)
Calcium: 10 mg/dL (ref 8.9–10.3)
Chloride: 102 mmol/L (ref 98–111)
Creatinine, Ser: 1.28 mg/dL — ABNORMAL HIGH (ref 0.44–1.00)
GFR calc Af Amer: 54 mL/min — ABNORMAL LOW (ref >60)
GFR calc non Af Amer: 46 mL/min — ABNORMAL LOW (ref >60)
Glucose, Bld: 91 mg/dL (ref 70–99)
Potassium: 3.6 mmol/L (ref 3.5–5.1)
Sodium: 140 mmol/L (ref 135–145)

## 2018-12-06 ENCOUNTER — Encounter (HOSPITAL_BASED_OUTPATIENT_CLINIC_OR_DEPARTMENT_OTHER): Payer: BC Managed Care – PPO | Admitting: Physician Assistant

## 2018-12-06 ENCOUNTER — Other Ambulatory Visit: Payer: Self-pay

## 2018-12-06 DIAGNOSIS — E11622 Type 2 diabetes mellitus with other skin ulcer: Secondary | ICD-10-CM | POA: Diagnosis not present

## 2018-12-06 NOTE — Progress Notes (Signed)
Tiffany Bullock, Tiffany Bullock ([id number]) Visit Report for 12/06/2018 Chief Complaint Document Details Patient Name: Date of Service: Tiffany Bullock, Tiffany Bullock 12/06/2018 8:00 AM Medical Record [id number] Patient Account Number: [id number] Date of Birth/Sex: Treating RN: 1960-08-18 (58 y.o. Tiffany Bullock Primary Care Provider: Al Bullock Other Clinician: Referring Provider: Treating Provider/Extender:Tiffany Bullock Weeks in Treatment: 3 Information Obtained from: Patient Chief Complaint Chest wall abscess/ulcer Electronic Signature(s) Signed: 12/06/2018 6:05:26 PM By: Tiffany Keeler PA-C Entered By: Tiffany Bullock on 12/06/2018 08:35:28 -------------------------------------------------------------------------------- HPI Details Patient Name: Date of Service: Tiffany Bullock, Tiffany Bullock 12/06/2018 8:00 AM Medical Record [id number] Patient Account Number: [id number] Date of Birth/Sex: Treating RN: [birth year] (58 y.o. Tiffany Bullock Primary Care Provider: Al Bullock Other Clinician: Referring Provider: Treating Provider/Extender:Tiffany Bullock Weeks in Treatment: 3 History of Present Illness HPI Description: 11/15/2018 upon evaluation today patient presents here in our clinic due to an issue that she has had that began on August 31. She is really not sure what exactly started first but nonetheless on August 31 she noted an abscess on her chest just below her Adam's apple area on the upper portion of her sternum. Subsequently she ended up going to the hospital on the first and ended up with an incision and drainage on the second at the site. She also did have blood cultures which were positive for MRSA. Subsequently she was placed on IV vancomycin. 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 Overall she seems to be doing much better at this point she is seeing infectious  disease at this point still and she is still on IV antibiotic therapy through home health. Fortunately there is no signs of active infection systemically given her trouble at this time which is good news. The wound itself also seems to be healing and doing great. 11/22/2018 on evaluation today patient appears to be doing well with regard to her wound. She has been tolerating the dressing changes without complication. Fortunately there is no signs of active infection. With regard to the measurements this is measuring significantly smaller compared to last week. Overall I am extremely pleased with the amount of progress that she is made in just 1 week's time. 11/29/2018 on evaluation today patient appears to be doing very well with regard to the wound on her sternal region. The good news is this is showing signs of excellent improvement it has actually divided into 2 with a skin island between on the right side of her body she actually does have hyper granulation unfortunately that I think we are getting need to address today. Other than that overall things seem to be doing excellent. 12/06/2018 on evaluation today patient's wound actually appears to be completely healed with excellent epithelization and overall she has done extremely well as far as I am concerned. Fortunately there is no signs of active infection at this time. Electronic Signature(s) Signed: 12/06/2018 6:05:26 PM By: Tiffany Keeler PA-C Entered By: Tiffany Bullock on 12/06/2018 08:38:41 -------------------------------------------------------------------------------- Physical Exam Details Patient Name: Date of Service: Tiffany Bullock, Tiffany Bullock 12/06/2018 8:00 AM Medical Record [id number] Patient Account Number: [id number] Date of Birth/Sex: Treating RN: 03-17-1960 (58 y.o. Tiffany Bullock Primary Care Provider: Al Bullock Other Clinician: Referring Provider: Treating Provider/Extender:Tiffany III, Eustace Pen,  Bullock Weeks in Treatment: 3 Constitutional Well-nourished and well-hydrated in no acute distress. Respiratory normal breathing without difficulty. Psychiatric this patient is able to make decisions and demonstrates good insight  into disease process. Alert and Oriented x 3. pleasant and cooperative. Notes Upon inspection patient's wounds show complete epithelialization at this point which is excellent news she is doing well and I do not think she is having any need any type of bandage over the area currently. She is very pleased that the wound is healed as MI. Electronic Signature(s) Signed: 12/06/2018 6:05:26 PM By: Tiffany Keeler PA-C Entered By: Tiffany Bullock on 12/06/2018 08:39:13 -------------------------------------------------------------------------------- Physician Orders Details Patient Name: Date of Service: [name], [name] 12/06/2018 8:00 AM Medical Record [id number] Patient Account Number: [id number] Date of Birth/Sex: Treating RN: February 15, 1961 (58 y.o. [name], [name] Primary Care Provider: Al Bullock Other Clinician: Referring Provider: Treating Provider/Extender:Tiffany Bullock Weeks in Treatment: 3 Verbal / Phone Orders: No Diagnosis Coding ICD-10 Coding Code Description L02.213 Cutaneous abscess of chest wall L98.492 Non-pressure chronic ulcer of skin of other sites with fat layer exposed E11.622 Type 2 diabetes mellitus with other skin ulcer I10 Essential (primary) hypertension I33.0 Acute and subacute infective endocarditis Discharge From [facility] Discharge from [place] - call if any future wound care needs. Electronic Signature(s) Signed: 12/06/2018 5:58:48 PM By: Deon Pilling Signed: 12/06/2018 6:05:26 PM By: Tiffany Keeler PA-C Entered By: Deon Pilling on 12/06/2018 08:37:02 -------------------------------------------------------------------------------- Problem List Details Patient Name: Date of  Service: [name], [name] 12/06/2018 8:00 AM Medical Record [id number] Patient Account Number: [id number] Date of Birth/Sex: Treating RN: 1960/05/20 (58 y.o. [name], [name] Primary Care Provider: Al Bullock Other Clinician: Referring Provider: Treating Provider/Extender:Tiffany Bullock Weeks in Treatment: 3 Active Problems ICD-10 Evaluated Encounter Code Description Active Date Today Diagnosis L02.213 Cutaneous abscess of chest wall 11/15/2018 No Yes L98.492 Non-pressure chronic ulcer of skin of other sites with 11/15/2018 No Yes fat layer exposed E11.622 Type 2 diabetes mellitus with other skin ulcer 11/15/2018 No Yes I10 Essential (primary) hypertension 11/15/2018 No Yes I33.0 Acute and subacute infective endocarditis 11/15/2018 No Yes Inactive Problems Resolved Problems Electronic Signature(s) Signed: 12/06/2018 6:05:26 PM By: Tiffany Keeler PA-C Entered By: Tiffany Bullock on 12/06/2018 08:35:24 -------------------------------------------------------------------------------- Progress Note Details Patient Name: Date of Service: Tiffany Bullock, Tiffany Bullock 12/06/2018 8:00 AM Medical Record [id number] Patient Account Number: [id number] Date of Birth/Sex: Treating RN: 03-Aug-1960 (58 y.o. Tiffany Bullock Primary Care Provider: Al Bullock Other Clinician: Referring Provider: Treating Provider/Extender:Tiffany Bullock Weeks in Treatment: 3 Subjective Chief Complaint Information obtained from Patient Chest wall abscess/ulcer History of Present Illness (HPI) 11/15/2018 upon evaluation today patient presents here in our clinic due to an issue that she has had that began on August 31. She is really not sure what exactly started first but nonetheless on August 31 she noted an abscess on her chest just below her Adam's apple area on the upper portion of her sternum. Subsequently she ended up going to the hospital on the first and ended up  with an incision and drainage on the second at the site. She also did have blood cultures which were positive for MRSA. Subsequently she was placed on IV vancomycin. 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 Overall she seems to be doing much better at this point she is seeing infectious disease at this point still and she is still on IV antibiotic therapy through home health. Fortunately there is no signs of active infection systemically given her trouble at this time which is good news. The wound itself also  seems to be healing and doing great. 11/22/2018 on evaluation today patient appears to be doing well with regard to her wound. She has been tolerating the dressing changes without complication. Fortunately there is no signs of active infection. With regard to the measurements this is measuring significantly smaller compared to last week. Overall I am extremely pleased with the amount of progress that she is made in just 1 week's time. 11/29/2018 on evaluation today patient appears to be doing very well with regard to the wound on her sternal region. The good news is this is showing signs of excellent improvement it has actually divided into 2 with a skin island between on the right side of her body she actually does have hyper granulation unfortunately that I think we are getting need to address today. Other than that overall things seem to be doing excellent. 12/06/2018 on evaluation today patient's wound actually appears to be completely healed with excellent epithelization and overall she has done extremely well as far as I am concerned. Fortunately there is no signs of active infection at this time. Patient History Information obtained from Patient. Family History Unknown History. Social History Never smoker, Marital Status - Single, Alcohol Use - Never, Drug Use - No History, Caffeine Use - Rarely. Medical  History Eyes Patient has history of Cataracts - both eyes Denies history of Glaucoma, Optic Neuritis Respiratory Patient has history of Sleep Apnea Denies history of Aspiration, Asthma, Chronic Obstructive Pulmonary Disease (COPD), Pneumothorax, Tuberculosis Cardiovascular Patient has history of Hypertension Denies history of Angina, Arrhythmia, Congestive Heart Failure, Coronary Artery Disease, Deep Vein Thrombosis, Hypotension, Myocardial Infarction, Peripheral Arterial Disease, Peripheral Venous Disease, Phlebitis, Vasculitis Endocrine Patient has history of Type II Diabetes Denies history of Type I Diabetes Medical And Surgical History Notes Cardiovascular Endocarditis of mitral valve Musculoskeletal Septic arthritis in right shoulder Review of Systems (ROS) Constitutional Symptoms (General Health) Denies complaints or symptoms of Fatigue, Fever, Chills, Marked Weight Change. Respiratory Denies complaints or symptoms of Chronic or frequent coughs, Shortness of Breath. Cardiovascular Denies complaints or symptoms of Chest pain. Psychiatric Denies complaints or symptoms of Claustrophobia, Suicidal. Objective Constitutional Well-nourished and well-hydrated in no acute distress. Vitals Time Taken: 8:30 AM, Height: 67 in, Weight: 370 lbs, BMI: 57.9, Temperature: 98.4 F, Pulse: 71 bpm, Respiratory Rate: 18 breaths/min, Blood Pressure: 163/100 mmHg. Respiratory normal breathing without difficulty. Psychiatric this patient is able to make decisions and demonstrates good insight into disease process. Alert and Oriented x 3. pleasant and cooperative. General Notes: Upon inspection patient's wounds show complete epithelialization at this point which is excellent news she is doing well and I do not think she is having any need any type of bandage over the area currently. She is very pleased that the wound is healed as MI. Integumentary (Hair, Skin) Wound #1 status is Open.  Original cause of wound was Surgical Injury. The wound is located on the Proximal Sternum. The wound measures 0cm length x 0cm width x 0cm depth; 0cm^2 area and 0cm^3 volume. There is no tunneling or undermining noted. There is a none present amount of drainage noted. The wound margin is flat and intact. There is no granulation within the wound bed. There is no necrotic tissue within the wound bed. 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 At this point I would recommend that we discontinue wound care services as the patient is completely healed. She is in agreement with this plan. If anything changes or worsens she will let us know otherwise we will just see her back as needed at this point. Electronic Signature(s) Signed: 12/06/2018 6:05:26 PM By: Tiffany Keeler PA-C Entered By: Tiffany Bullock on 12/06/2018 08:48:44 -------------------------------------------------------------------------------- HxROS Details Patient Name: Date of Service: Tiffany Bullock, Tiffany Bullock 12/06/2018 8:00 AM Medical Record [id number] Patient Account Number: [id number] Date of Birth/Sex: Treating RN: [birth year] (58 y.o. Tiffany Bullock Primary Care Provider: Al Bullock Other Clinician: Referring Provider: Treating Provider/Extender:Tiffany Bullock Weeks in Treatment: 3 Information Obtained From Patient Constitutional Symptoms (General Health) Complaints and Symptoms: Negative for: Fatigue; Fever; Chills; Marked Weight Change Respiratory Complaints and Symptoms: Negative for: Chronic or frequent coughs; Shortness of Breath Medical History: Positive for: Sleep  Apnea Negative for: Aspiration; Asthma; Chronic Obstructive Pulmonary Disease (COPD); Pneumothorax; Tuberculosis Cardiovascular Complaints and Symptoms: Negative for: Chest pain Medical History: Positive for: Hypertension Negative for: Angina; Arrhythmia; Congestive Heart Failure; Coronary Artery Disease; Deep Vein Thrombosis; Hypotension; Myocardial Infarction; Peripheral Arterial Disease; Peripheral Venous Disease; Phlebitis; Vasculitis Past Medical History Notes: Endocarditis of mitral valve Psychiatric Complaints and Symptoms: Negative for: Claustrophobia; Suicidal Eyes Medical History: Positive for: Cataracts - both eyes Negative for: Glaucoma; Optic Neuritis Endocrine Medical History: Positive for: Type II Diabetes Negative for: Type I Diabetes Musculoskeletal Medical History: Past Medical History Notes: Septic arthritis in right shoulder HBO Extended History Items Eyes: Cataracts Immunizations Pneumococcal Vaccine: Received Pneumococcal Vaccination: No Implantable Devices None Family and Social History Unknown History: Yes; Never smoker; Marital Status - Single; Alcohol Use: Never; Drug Use: No History; Caffeine Use: Rarely; Financial Concerns: No; Food, Clothing or Shelter Needs: No; Support System Lacking: No; Transportation Concerns: No Physician Affirmation I have reviewed and agree with the above information. Electronic Signature(s) Signed: 12/06/2018 5:58:48 PM By: Deon Pilling Signed: 12/06/2018 6:05:26 PM By: Tiffany Keeler PA-C Entered By: Tiffany Bullock on 12/06/2018 08:39:00 -------------------------------------------------------------------------------- SuperBill Details Patient Name: Date of Service: Tiffany Bullock, Tiffany Bullock 12/06/2018 Medical Record [id number] Patient Account Number: [id number] Date of Birth/Sex: Treating RN: 05/15/1960 (58 y.o. [name], [name] Primary Care Provider: Al Bullock Other Clinician: Referring Provider:  Treating Provider/Extender:Tiffany Bullock Weeks in Treatment: 3 Diagnosis Coding ICD-10 Codes Code Description L02.213 Cutaneous abscess of chest wall L98.492 Non-pressure chronic ulcer of skin of other sites with fat layer exposed E11.622 Type 2 diabetes mellitus with other skin ulcer I10 Essential (primary) hypertension I33.0 Acute and subacute infective endocarditis Facility Procedures CPT4 Code: [id number] Description: 99213 - WOUND CARE VISIT-LEV 3 EST PT Modifier: Quantity: 1 Physician Procedures CPT4 Code Description: [id number] - WC PHYS LEVEL 2 - EST PT ICD-10 Diagnosis Description L02.213 Cutaneous abscess of chest wall L98.492 Non-pressure chronic ulcer of skin of other sites with fa E11.622 Type 2 diabetes mellitus with other skin ulcer  I10 Essential (primary) hypertension Modifier: t layer exposed Quantity: 1 Electronic Signature(s) Signed: 12/06/2018 6:05:26 PM By: Tiffany Keeler PA-C Entered By: Tiffany Bullock on 12/06/2018 08:48:55

## 2018-12-12 ENCOUNTER — Other Ambulatory Visit: Payer: Self-pay

## 2018-12-12 ENCOUNTER — Encounter: Payer: Self-pay | Admitting: Radiation Oncology

## 2018-12-12 ENCOUNTER — Ambulatory Visit (INDEPENDENT_AMBULATORY_CARE_PROVIDER_SITE_OTHER): Payer: BC Managed Care – PPO | Admitting: Radiation Oncology

## 2018-12-12 VITALS — BP 156/84 | HR 98 | Temp 98.2°F | Wt 376.0 lb

## 2018-12-12 DIAGNOSIS — Z8739 Personal history of other diseases of the musculoskeletal system and connective tissue: Secondary | ICD-10-CM

## 2018-12-12 DIAGNOSIS — Z6841 Body Mass Index (BMI) 40.0 and over, adult: Secondary | ICD-10-CM

## 2018-12-12 DIAGNOSIS — B9562 Methicillin resistant Staphylococcus aureus infection as the cause of diseases classified elsewhere: Secondary | ICD-10-CM

## 2018-12-12 DIAGNOSIS — Z8679 Personal history of other diseases of the circulatory system: Secondary | ICD-10-CM

## 2018-12-12 DIAGNOSIS — Z0000 Encounter for general adult medical examination without abnormal findings: Secondary | ICD-10-CM

## 2018-12-12 DIAGNOSIS — N179 Acute kidney failure, unspecified: Secondary | ICD-10-CM

## 2018-12-12 DIAGNOSIS — G629 Polyneuropathy, unspecified: Secondary | ICD-10-CM | POA: Diagnosis not present

## 2018-12-12 DIAGNOSIS — Z8614 Personal history of Methicillin resistant Staphylococcus aureus infection: Secondary | ICD-10-CM

## 2018-12-12 DIAGNOSIS — M009 Pyogenic arthritis, unspecified: Secondary | ICD-10-CM

## 2018-12-12 DIAGNOSIS — Z79899 Other long term (current) drug therapy: Secondary | ICD-10-CM

## 2018-12-12 DIAGNOSIS — R7881 Bacteremia: Secondary | ICD-10-CM

## 2018-12-12 DIAGNOSIS — R7303 Prediabetes: Secondary | ICD-10-CM | POA: Diagnosis not present

## 2018-12-12 DIAGNOSIS — I10 Essential (primary) hypertension: Secondary | ICD-10-CM | POA: Diagnosis not present

## 2018-12-12 DIAGNOSIS — I059 Rheumatic mitral valve disease, unspecified: Secondary | ICD-10-CM

## 2018-12-12 DIAGNOSIS — Z9889 Other specified postprocedural states: Secondary | ICD-10-CM

## 2018-12-12 DIAGNOSIS — I058 Other rheumatic mitral valve diseases: Secondary | ICD-10-CM

## 2018-12-12 DIAGNOSIS — Z1231 Encounter for screening mammogram for malignant neoplasm of breast: Secondary | ICD-10-CM

## 2018-12-12 MED ORDER — GABAPENTIN 300 MG PO CAPS: 600.0000 mg | ORAL_CAPSULE | Freq: Two times a day (BID) | ORAL | 3 refills | Status: AC

## 2018-12-12 NOTE — Assessment & Plan Note (Addendum)
Filed Weights   12/12/18 0840  Weight: (!) 376 lb (170.6 kg)   -pt with hx of morbid obesity, most recent BMI 58.89 -counseled patient on benefits of diet, exercise and weight loss for control of other medical problems, most notably today her high blood pressure -patient will work to make lifestyle changes with follow up in 4 weeks  -5 lb weight loss goal for 4 weeks   PLAN: lifestyle changes, fu in 4 weeks

## 2018-12-12 NOTE — Assessment & Plan Note (Addendum)
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

## 2018-12-12 NOTE — Assessment & Plan Note (Addendum)
-  pt with known hypertension, prediabetes with recent elevation in Creatinine, most recently 1.28 up from around 0.90   -this is in the setting of a recent illness, cardiothoracic surgery and long term treatment with IV vancomycin  -counseled patient on good hydration and avoiding NSAIDs -will recheck with BMP at next visit in 4 weeks

## 2018-12-12 NOTE — Assessment & Plan Note (Signed)
-  pt with recent admission and surgery for MRSA bacteremia with septic arthritis of right [place] joint and presumed mitral valve endocarditis s/p IV antibiotics -patient without fevers, chills, rashes, joint pain -feels much improved since cessation of antibiotics with resolution of joint pain -cleared to return to work after her eye check on 11/10

## 2018-12-12 NOTE — Assessment & Plan Note (Signed)
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

## 2018-12-12 NOTE — Assessment & Plan Note (Addendum)
-  declined TDAP -Hep C screen at next visit with BMP (remote hx of blood transfusion) -pap smear at next visit

## 2018-12-12 NOTE — Patient Instructions (Addendum)
Thank you for coming to your appointment. It was so nice to see you. Today we discussed  Hypertension: -continue current medications -walk for 30 min 3 days/week -avoid salty and fatty foods -you've already lost 5 lbs. That's great!  -weight loss goal of 5 more lbs before next appointment -follow up in 4 weeks  Endocarditis of mitral valve Septic arthritis MRSA bacteremia -you have finished your antibiotic and you are symptom free -return to work in 1 week  Healthcare maintenance -Ordered mammogram   -Plan for pap smear at next visit -Hepatitis C screen at next visit   Weight management:  -walk for 30 min 3 days/week -avoid salty and fatty foods -you've already lost 5 lbs. That's great!  -weight loss goal of 5 lbs before next appointment -follow up in 4 weeks  Numbness and tingling of left leg -increase gabapentin from 300 mg once a day to 600 mg twice a day -please start with one 600 mg pill once a day at nighttime for 3 days -after 3 days, you can start 600 mg twice a day, once in the morning, once in the evening -please call us if your symptoms do not improve  AKI (acute kidney injury)  -your kidney function decreased slightly since your recent hospitalization and surgery -please drink lots of water and avoid NSAIDs like aleve and advil, tylenol is OK to use  -follow up in 4 weeks with blood work to check kidney function -blood pressure effects kidney function so improvement in blood pressure with diet and exercise will help your kidneys too!  Please keep up the good work. I look forward to seeing you again soon at your follow up in 4 weeks.  Sincerely,  [name], MD

## 2018-12-12 NOTE — Progress Notes (Signed)
   CC: leg pain  HPI:  [name] is a 58 y.o. F here for leg pain and for follow up of her chronic medical problems.   Past Medical History:  Diagnosis Date  . Anemia   . Blood transfusion without reported diagnosis Age 68  . Endocarditis of mitral valve    with MRSA, tx'ed in [id number] complicated by septic PEs and RT [place] septic arthritis  . Essential hypertension 10/24/2018  . Obesity (BMI 35.0-39.9 without comorbidity)   . Personal history of colonic polyp - adenoma 11/05/2013  . Sleep apnea    uses cpap   Review of Systems:    Review of Systems  Constitutional: Negative for chills and fever.  HENT: Negative for sore throat.   Respiratory: Negative for cough.   Gastrointestinal: Negative for abdominal pain.  Musculoskeletal: Negative for falls and joint pain.  Skin: Negative for rash.  Neurological: Positive for tingling (left leg pins and needles).   Physical Exam:  Vitals:   12/12/18 0840  BP: (!) 156/84  Pulse: 98  Temp: 98.2 F (36.8 C)  TempSrc: Oral  SpO2: 100%  Weight: (!) 376 lb (170.6 kg)   Physical Exam  Constitutional: She is oriented to person, place, and time. No distress.  Obese female  HENT:  Head: Normocephalic and atraumatic.  Cardiovascular: Normal rate, regular rhythm and normal heart sounds. Exam reveals no friction rub.  No murmur heard. No LE edema  Pulmonary/Chest: Effort normal and breath sounds normal. No respiratory distress. She has no wheezes.  Abdominal: Soft. Bowel sounds are normal. She exhibits no distension. There is no abdominal tenderness.  Musculoskeletal: Normal range of motion.        General: No tenderness, deformity or edema.  Neurological: She is alert and oriented to person, place, and time.  Skin: Skin is warm and dry. She is not diaphoretic. No erythema.  Psychiatric: Affect normal.  Nursing note and vitals reviewed.  Assessment & Plan:   See Encounters Tab for problem based charting.  Patient seen with  Dr. Lynnae January

## 2018-12-12 NOTE — Progress Notes (Signed)
Internal Medicine Clinic Attending  I saw and evaluated the patient.  I personally confirmed the key portions of the history and exam documented by Dr. Lanier and I reviewed pertinent patient test results.  The assessment, diagnosis, and plan were formulated together and I agree with the documentation in the resident's note.   

## 2018-12-12 NOTE — Assessment & Plan Note (Addendum)
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

## 2018-12-12 NOTE — Assessment & Plan Note (Addendum)
Today's Vitals   12/12/18 0840 12/12/18 0847  BP: (!) 156/84   Pulse: 98   Temp: 98.2 F (36.8 C)   TempSrc: Oral   SpO2: 100%   Weight: (!) 376 lb (170.6 kg)   PainSc:  9    Body mass index is 58.89 kg/m.   -pt with hx of hypertension on valsartan-HCTZ with multiple elevated Bps in ambulatory setting recently -repeat bp 142/87 -discussed with patient possible need for additional BP medication -counseled patient on benefits of diet, exercise and weight loss for blood pressure control -patient will work to make lifestyle changes with follow up in 4 weeks with plan to start additional BP med if BP not controlled at that time  PLAN: continue current meds, lifestyle changes, fu in 4 weeks

## 2018-12-13 ENCOUNTER — Encounter: Payer: Self-pay | Admitting: Radiation Oncology

## 2019-01-03 ENCOUNTER — Other Ambulatory Visit: Payer: Self-pay

## 2019-01-03 NOTE — Telephone Encounter (Signed)
valsartan-hydrochlorothiazide (DIOVAN-HCT) 320-25 MG tablet   Refill request @  [place] ([place]), [place] - [address] [phone] (Phone) [phone] (Fax)

## 2019-01-09 MED ORDER — VALSARTAN-HYDROCHLOROTHIAZIDE 320-25 MG PO TABS: 1.0000 | ORAL_TABLET | Freq: Every day | ORAL | 0 refills | Status: AC

## 2019-01-22 ENCOUNTER — Encounter: Payer: Self-pay | Admitting: Radiation Oncology

## 2019-01-29 ENCOUNTER — Ambulatory Visit: Payer: BC Managed Care – PPO

## 2019-01-30 NOTE — Progress Notes (Signed)
Tiffany Bullock, Tiffany Bullock ([id number]) Visit Report for 12/06/2018 Arrival Information Details Patient Name: Date of Service: Tiffany Bullock, Tiffany Bullock 12/06/2018 8:00 AM Medical Record [id number] Patient Account Number: [id number] Date of Birth/Sex: Treating RN: 04-Oct-1960 (58 y.o. Orvan Falconer Primary Care Aashi Derrington: [name] Other Clinician: Referring Henlee Donovan: Treating Laurance Heide/Extender:Stone III, Eustace Pen, CLAIRE Weeks in Treatment: 3 Visit Information History Since Last Visit All ordered tests and consults were completed: No Patient Arrived: Ambulatory Added or deleted any medications: No Arrival Time: 08:29 Any new allergies or adverse reactions: No Accompanied By: self Had a fall or experienced change in No Transfer Assistance: None activities of daily living that may affect Patient Identification Verified: Yes risk of falls: Secondary Verification Process Yes Signs or symptoms of abuse/neglect since last No Completed: visito Patient Requires Transmission-Based No Hospitalized since last visit: No Precautions: Implantable device outside of the clinic excluding No Patient Has Alerts: No cellular tissue based products placed in the center since last visit: Has Dressing in Place as Prescribed: Yes Pain Present Now: No Electronic Signature(s) Signed: 01/30/2019 3:01:15 PM By: Carlene Coria RN Entered By: Carlene Coria on 12/06/2018 08:30:23 -------------------------------------------------------------------------------- Clinic Level of Care Assessment Details Patient Name: Date of Service: Tiffany Bullock, Tiffany Bullock 12/06/2018 8:00 AM Medical Record [id number] Patient Account Number: [id number] Date of Birth/Sex: Treating RN: 21-Jun-1960 (58 y.o. [name], [name] Primary Care Deklyn Gibbon: [name] Other Clinician: Referring Audriana Aldama: Treating Regenia Erck/Extender:Stone III, Eustace Pen, CLAIRE Weeks in Treatment: 3 Clinic Level of Care Assessment Items TOOL 4  Quantity Score X - Use when only an EandM is performed on FOLLOW-UP visit 1 0 ASSESSMENTS - Nursing Assessment / Reassessment X - Reassessment of Co-morbidities (includes updates in patient status) 1 10 X - Reassessment of Adherence to Treatment Plan 1 5 ASSESSMENTS - Wound and Skin Assessment / Reassessment X - Simple Wound Assessment / Reassessment - one wound 1 5 []  - Complex Wound Assessment / Reassessment - multiple wounds 0 X - Dermatologic / Skin Assessment (not related to wound area) 1 10 ASSESSMENTS - Focused Assessment []  - Circumferential Edema Measurements - multi extremities 0 X - Nutritional Assessment / Counseling / Intervention 1 10 []  - Lower Extremity Assessment (monofilament, tuning fork, pulses) 0 []  - Peripheral Arterial Disease Assessment (using hand held doppler) 0 ASSESSMENTS - Ostomy and/or Continence Assessment and Care []  - Incontinence Assessment and Management 0 []  - Ostomy Care Assessment and Management (repouching, etc.) 0 PROCESS - Coordination of Care X - Simple Patient / Family Education for ongoing care 1 15 []  - Complex (extensive) Patient / Family Education for ongoing care 0 X - Staff obtains Programmer, systems, Records, Test Results / Process Orders 1 10 []  - Staff telephones HHA, Nursing Homes / Clarify orders / etc 0 []  - Routine Transfer to another Facility (non-emergent condition) 0 []  - Routine Hospital Admission (non-emergent condition) 0 []  - New Admissions / Biomedical engineer / Ordering NPWT, Apligraf, etc. 0 []  - Emergency Hospital Admission (emergent condition) 0 X - Simple Discharge Coordination 1 10 []  - Complex (extensive) Discharge Coordination 0 PROCESS - Special Needs []  - Pediatric / Minor Patient Management 0 []  - Isolation Patient Management 0 []  - Hearing / Language / Visual special needs 0 []  - Assessment of Community assistance (transportation, D/C planning, etc.) 0 []  - Additional assistance / Altered mentation 0 []  -  Support Surface(s) Assessment (bed, cushion, seat, etc.) 0 INTERVENTIONS - Wound Cleansing / Measurement X - Simple Wound Cleansing - one wound 1 5 []  - Complex Wound Cleansing -  multiple wounds 0 X - Wound Imaging (photographs - any number of wounds) 1 5 []  - Wound Tracing (instead of photographs) 0 X - Simple Wound Measurement - one wound 1 5 []  - Complex Wound Measurement - multiple wounds 0 INTERVENTIONS - Wound Dressings X - Small Wound Dressing one or multiple wounds 1 10 []  - Medium Wound Dressing one or multiple wounds 0 []  - Large Wound Dressing one or multiple wounds 0 []  - Application of Medications - topical 0 []  - Application of Medications - injection 0 INTERVENTIONS - Miscellaneous []  - External ear exam 0 []  - Specimen Collection (cultures, biopsies, blood, body fluids, etc.) 0 []  - Specimen(s) / Culture(s) sent or taken to Lab for analysis 0 []  - Patient Transfer (multiple staff / Civil Service fast streamer / Similar devices) 0 []  - Simple Staple / Suture removal (25 or less) 0 []  - Complex Staple / Suture removal (26 or more) 0 []  - Hypo / Hyperglycemic Management (close monitor of Blood Glucose) 0 []  - Ankle / Brachial Index (ABI) - do not check if billed separately 0 X - Vital Signs 1 5 Has the patient been seen at the hospital within the last three years: Yes Total Score: 105 Level Of Care: New/Established - Level 3 Electronic Signature(s) Signed: 12/06/2018 5:58:48 PM By: Deon Pilling Entered By: Deon Pilling on 12/06/2018 08:38:36 -------------------------------------------------------------------------------- Encounter Discharge Information Details Patient Name: Date of Service: Tiffany Bullock, Tiffany Bullock 12/06/2018 8:00 AM Medical Record [id number] Patient Account Number: [id number] Date of Birth/Sex: Treating RN: April 20, 1960 (58 y.o. [name] Primary Care Tyresa Prindiville: [name] Other Clinician: Referring Tahj Lindseth: Treating Lanee Chain/Extender:Stone III,  Eustace Pen, CLAIRE Weeks in Treatment: 3 Encounter Discharge Information Items Discharge Condition: Stable Ambulatory Status: Ambulatory Discharge Destination: Home Transportation: Private Auto Accompanied By: self Schedule Follow-up Appointment: Yes Clinical Summary of Care: Electronic Signature(s) Signed: 12/06/2018 5:58:48 PM By: Deon Pilling Entered By: Deon Pilling on 12/06/2018 08:39:01 -------------------------------------------------------------------------------- [place] Details Patient Name: Date of Service: Tiffany Bullock, Tiffany Bullock 12/06/2018 8:00 AM Medical Record [id number] Patient Account Number: [id number] Date of Birth/Sex: Treating RN: [birth year] (58 y.o. [name] Primary Care Shaheem Pichon: [name] Other Clinician: Referring Faizan Geraci: Treating Diedra Sinor/Extender:Stone III, Eustace Pen, CLAIRE Weeks in Treatment: 3 Active Inactive Electronic Signature(s) Signed: 12/06/2018 5:58:48 PM By: Deon Pilling Entered By: Deon Pilling on 12/06/2018 08:37:09 -------------------------------------------------------------------------------- Pain Assessment Details Patient Name: Date of Service: Tiffany Bullock, Tiffany Bullock 12/06/2018 8:00 AM Medical Record [id number] Patient Account Number: [id number] Date of Birth/Sex: Treating RN: [birth year] (58 y.o. Orvan Falconer Primary Care Cuauhtemoc Huegel: [name] Other Clinician: Referring Jeri Rawlins: Treating Wendelin Bradt/Extender:Stone III, Eustace Pen, CLAIRE Weeks in Treatment: 3 Active Problems Location of Pain Severity and Description of Pain Patient Has Paino No Site Locations Pain Management and Medication Current Pain Management: Electronic Signature(s) Signed: 01/30/2019 3:01:15 PM By: Carlene Coria RN Entered By: Carlene Coria on 12/06/2018 08:31:57 -------------------------------------------------------------------------------- Patient/Caregiver Education Details Tiffany Bullock,  10/14/2020andnbsp8:00 Patient Name: Date of Service: Tiffany Bullock AM Medical Record Patient Account Number: [id number] Number: Treating RN: Deon Pilling Date of Birth/Gender: Oct 10, 1960 (58 y.o. F) Other Clinician: Primary Care Physician: [name] Treating Worthy Keeler Referring Physician: Physician/Extender: Michelene Gardener in Treatment: 3 Education Assessment Education Provided To: Patient Education Topics Provided Wound/Skin Impairment: Handouts: Skin Care Do's and Dont's Methods: Explain/Verbal Responses: Reinforcements needed Electronic Signature(s) Signed: 12/06/2018 5:58:48 PM By: Deon Pilling Entered By: Deon Pilling on 12/06/2018 08:37:24 -------------------------------------------------------------------------------- Wound Assessment Details Patient Name: Date of Service: Tiffany Bullock, Tiffany Bullock 12/06/2018 8:00 AM Medical Record [id number] Patient  Account Number: [id number] Date of Birth/Sex: Treating RN: 06-24-1960 (58 y.o. Orvan Falconer Primary Care Ivette Castronova: [name] Other Clinician: Referring Jamaris Biernat: Treating Thurman Sarver/Extender:Stone III, Eustace Pen, CLAIRE Weeks in Treatment: 3 Wound Status Wound Number: 1 Primary Abscess Etiology: Wound Location: Sternum - Proximal Wound Healed - Epithelialized Wounding Event: Surgical Injury Status: Date Acquired: 10/24/2018 Comorbid Cataracts, Sleep Apnea, Hypertension, Weeks Of Treatment: 3 History: Type II Diabetes Clustered Wound: Yes Photos Wound Measurements Length: (cm) 0 % Reduct Width: (cm) 0 % Reduct Depth: (cm) 0 Epitheli Clustered Quantity: 2 Tunnelin Area: (cm) 0 Undermi Volume: (cm) 0 Wound Description Full Thickness Without Exposed Support Foul Od Classification: Structures Slough/ Wound Flat and Intact Margin: Exudate None Present Amount: Wound Bed Granulation Amount: None Present (0%) Necrotic Amount: None Present (0%) Fascia Expos Fat Layer (S Tendon  Expos Muscle Expos Joint Expose Bone Exposed or After Cleansing: No Fibrino No Exposed Structure ed: No ubcutaneous Tissue) Exposed: No ed: No ed: No d: No : No ion in Area: 100% ion in Volume: 100% alization: Large (67-100%) g: No ning: No Electronic Signature(s) Signed: 12/12/2018 1:32:19 PM By: Mikeal Hawthorne EMT/HBOT Signed: 01/30/2019 3:01:15 PM By: Carlene Coria RN Entered By: Mikeal Hawthorne on 12/07/2018 08:16:48 -------------------------------------------------------------------------------- Vitals Details Patient Name: Date of Service: Tiffany Bullock, Tiffany Bullock 12/06/2018 8:00 AM Medical Record [id number] Patient Account Number: [id number] Date of Birth/Sex: Treating RN: [birth year] (58 y.o. Orvan Falconer Primary Care Gunhild Bautch: [name] Other Clinician: Referring Mathilda Maguire: Treating Kalanie Fewell/Extender:Stone III, Eustace Pen, CLAIRE Weeks in Treatment: 3 Vital Signs Time Taken: 08:30 Temperature (F): 98.4 Height (in): 67 Pulse (bpm): 71 Weight (lbs): 370 Respiratory Rate (breaths/min): 18 Body Mass Index (BMI): 57.9 Blood Pressure (mmHg): 163/100 Reference Range: 80 - 120 mg / dl Electronic Signature(s) Signed: 01/30/2019 3:01:15 PM By: Carlene Coria RN Entered By: Carlene Coria on 12/06/2018 08:31:32

## 2019-02-13 ENCOUNTER — Ambulatory Visit: Payer: BC Managed Care – PPO

## 2019-06-29 ENCOUNTER — Encounter: Payer: Self-pay | Admitting: *Deleted

## 2020-02-23 IMAGING — MR MR HEAD W/O CM
10 of 11 series · 42 of 48 positions shown · non-contrast
Comparison: None.

CLINICAL DATA: Paresthesia. Rule out stroke. History of bacterial
endocarditis.

EXAM:
MRI HEAD WITHOUT CONTRAST
TECHNIQUE: Multiplanar, multiecho pulse sequences of the brain and surrounding
structures were obtained without intravenous contrast.

[Series 5: DWI · axial · 3.0mm · 0.88mm/px · z∈[-88,+47]mm · 8 of 92 slices shown (1 of 4)]
[im 1/92]
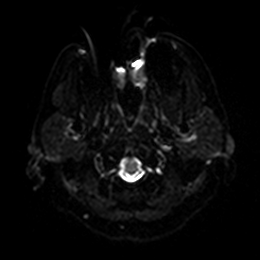
[im 14/92]
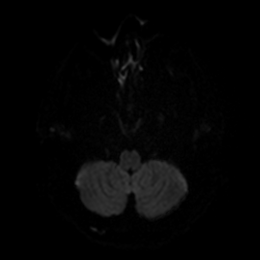
[im 27/92]
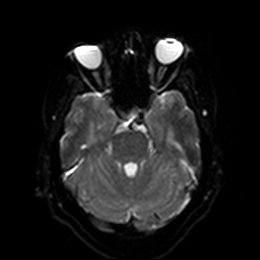
[im 40/92]
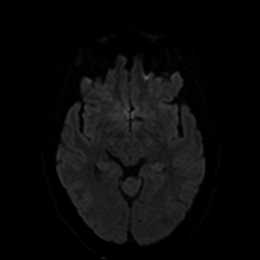
[im 53/92]
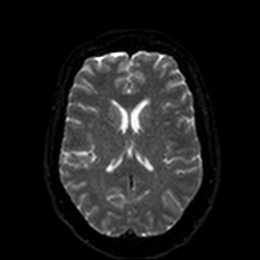
[im 66/92]
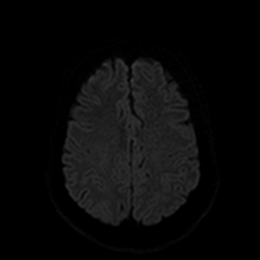
[im 79/92]
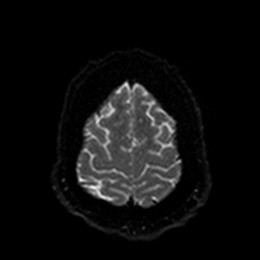
[im 92/92]
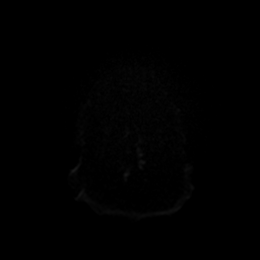

[Series 6: DWI · axial · 3.0mm · 0.88mm/px · z∈[-88,+47]mm · 4 of 46 slices shown (2 of 4)]
[im 1/46]
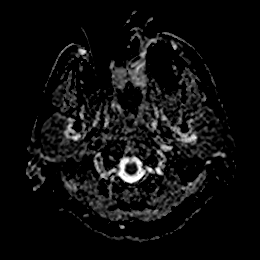
[im 16/46]
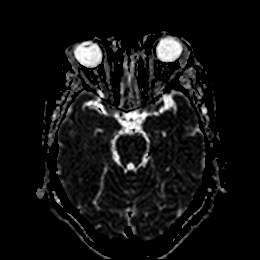
[im 31/46]
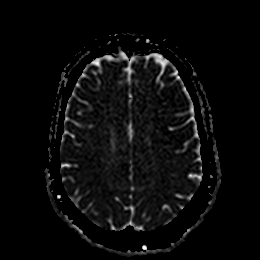
[im 46/46]
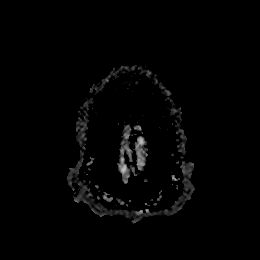

[Series 7: DWI · coronal · 4.0mm · 0.88mm/px · 6 of 66 slices shown (3 of 4)]
[im 1/66]
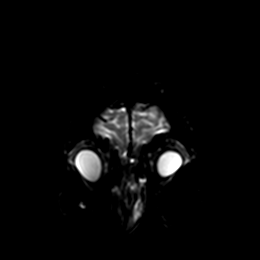
[im 14/66]
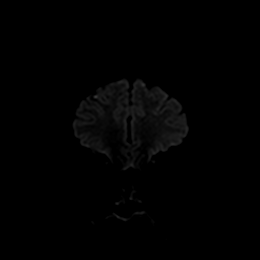
[im 27/66]
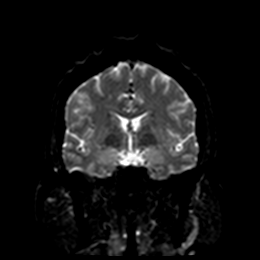
[im 40/66]
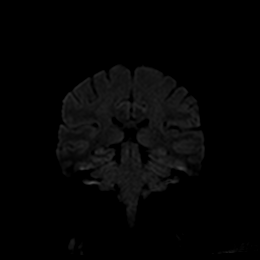
[im 53/66]
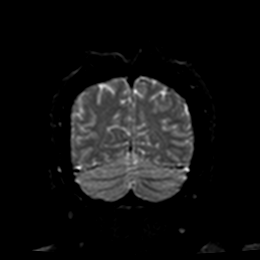
[im 66/66]
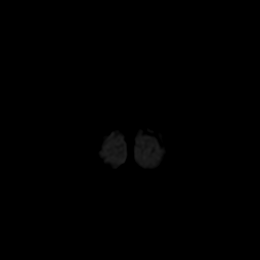

[Series 8: DWI · coronal · 4.0mm · 0.88mm/px · 3 of 33 slices shown (4 of 4)]
[im 1/33]
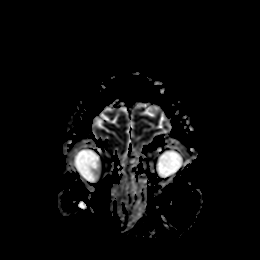
[im 17/33]
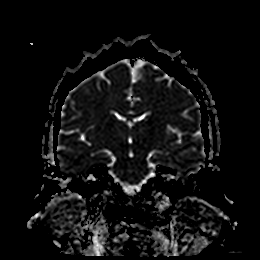
[im 33/33]
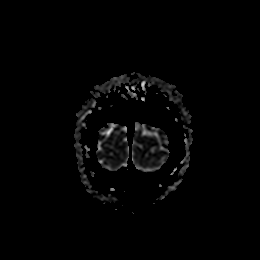

[Series 9: T1 · sagittal · 5.0mm · 0.75mm/px · 2 of 23 slices shown]
[im 1/23]
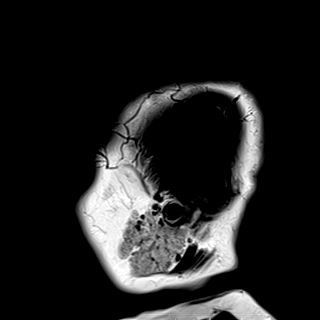
[im 23/23]
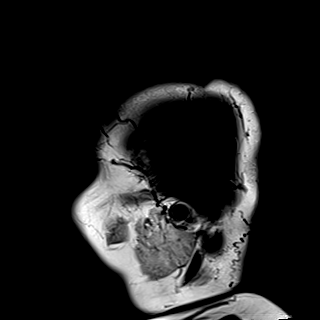

[Series 10: T2 · axial · 5.0mm · 0.72mm/px · z∈[-94,+49]mm · 2 of 25 slices shown]
[im 1/25]
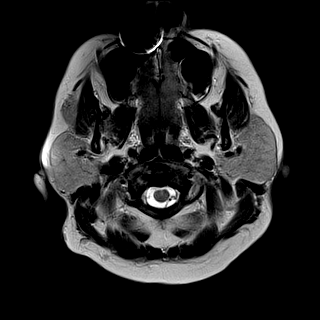
[im 25/25]
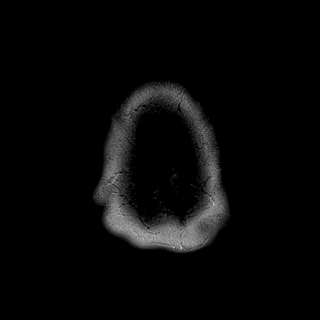

[Series 11: FLAIR · axial · 5.0mm · 0.45mm/px · z∈[-95,+49]mm · 2 of 25 slices shown]
[im 1/25]
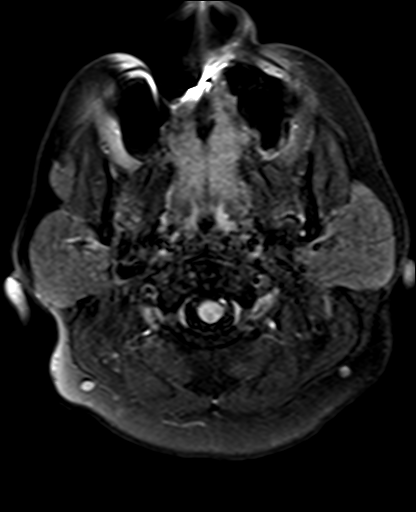
[im 25/25]
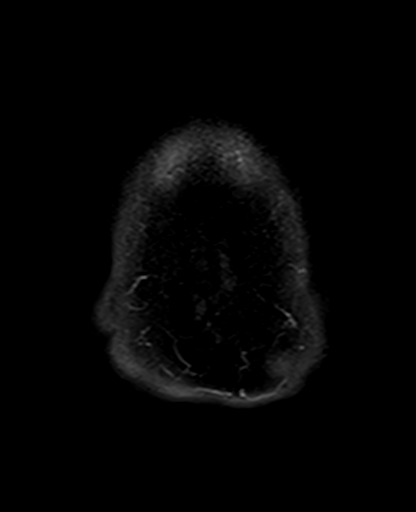

[Series 13: pha_images · axial · 3.0mm · 0.90mm/px · z∈[-104,+72]mm · 6 of 60 slices shown]
[im 1/60]
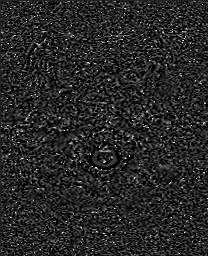
[im 12/60]
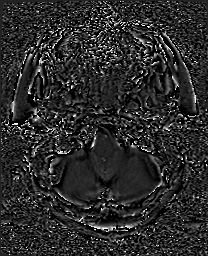
[im 24/60]
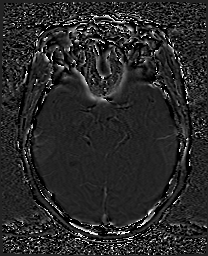
[im 36/60]
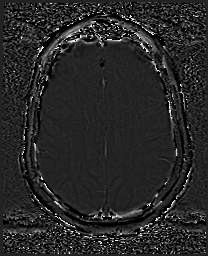
[im 48/60]
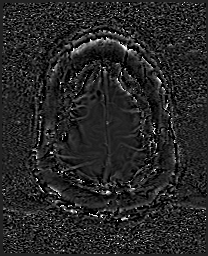
[im 60/60]
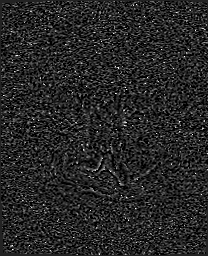

[Series 14: swi_images · axial · 3.0mm · 0.90mm/px · z∈[-104,+72]mm · 6 of 60 slices shown]
[im 1/60]
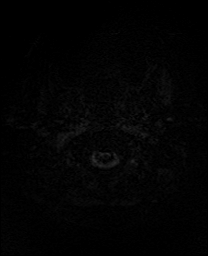
[im 12/60]
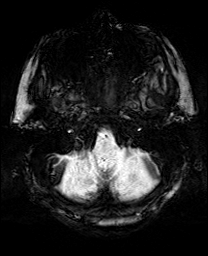
[im 24/60]
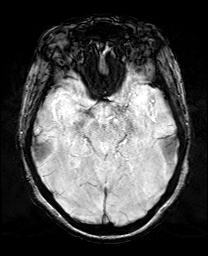
[im 36/60]
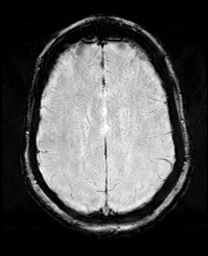
[im 48/60]
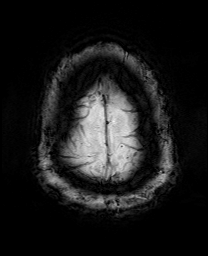
[im 60/60]
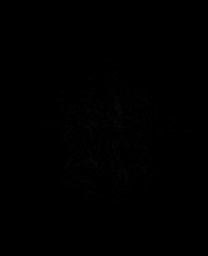

[Series 17: T2 post-contrast · coronal · 5.0mm · 0.72mm/px · 3 of 28 slices shown]
[im 1/28]
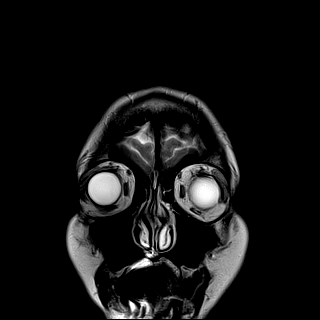
[im 14/28]
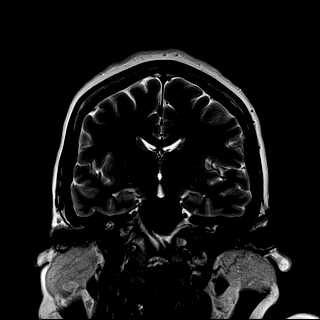
[im 28/28]
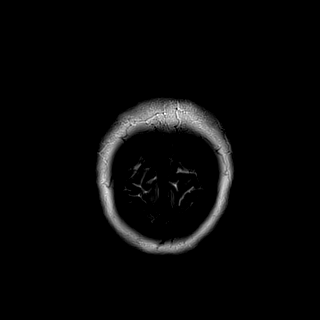

[42 of 48 positions shown; findings below may reference images not displayed]

FINDINGS: Brain: Ventricle size and cerebral volume normal. Negative for acute
infarct. Scattered small white matter hyperintensities, mild in
degree. These are mostly in the subcortical and deep white matter.
Negative for hemorrhage mass or edema.

Vascular: Normal arterial flow voids

Skull and upper cervical spine: Negative

Sinuses/Orbits: Negative

Other: None
IMPRESSION: No acute abnormality.  Negative for acute infarct or mass

Scattered small subcortical white matter hyperintensities likely due
to chronic microvascular ischemia

## 2020-08-26 ENCOUNTER — Encounter: Payer: Self-pay | Admitting: *Deleted

## 2021-03-28 ENCOUNTER — Encounter (INDEPENDENT_AMBULATORY_CARE_PROVIDER_SITE_OTHER): Payer: Self-pay

## 2021-04-22 ENCOUNTER — Encounter: Payer: Self-pay | Admitting: Internal Medicine

## 2021-09-30 ENCOUNTER — Encounter (INDEPENDENT_AMBULATORY_CARE_PROVIDER_SITE_OTHER): Payer: Self-pay

## 2022-05-03 ENCOUNTER — Ambulatory Visit (HOSPITAL_COMMUNITY): Payer: BC Managed Care – PPO

## 2024-01-25 ENCOUNTER — Other Ambulatory Visit: Payer: Self-pay

## 2024-01-25 DIAGNOSIS — Z1231 Encounter for screening mammogram for malignant neoplasm of breast: Secondary | ICD-10-CM

## 2024-02-14 ENCOUNTER — Ambulatory Visit
